# Patient Record
Sex: Female | Born: 1945 | Race: Black or African American | Hispanic: No | Marital: Married | Smoking: Never smoker
Health system: Southern US, Community
[De-identification: ages and names within clinical notes are randomized; demographics above are authoritative.]

## PROBLEM LIST (undated history)

## (undated) DIAGNOSIS — M1990 Unspecified osteoarthritis, unspecified site: Secondary | ICD-10-CM

## (undated) DIAGNOSIS — K219 Gastro-esophageal reflux disease without esophagitis: Secondary | ICD-10-CM

## (undated) DIAGNOSIS — I10 Essential (primary) hypertension: Secondary | ICD-10-CM

## (undated) DIAGNOSIS — E162 Hypoglycemia, unspecified: Secondary | ICD-10-CM

## (undated) HISTORY — PX: NECK SURGERY: SHX720

## (undated) HISTORY — PX: ABDOMINAL HYSTERECTOMY: SHX81

## (undated) HISTORY — PX: TONSILLECTOMY: SUR1361

## (undated) HISTORY — PX: CHOLECYSTECTOMY: SHX55

## (undated) HISTORY — PX: BACK SURGERY: SHX140

## (undated) HISTORY — PX: OTHER SURGICAL HISTORY: SHX169

## (undated) HISTORY — PX: GASTRIC BYPASS: SHX52

---

## 2010-02-25 ENCOUNTER — Encounter
Admission: RE | Admit: 2010-02-25 | Discharge: 2010-02-25 | Payer: Self-pay | Source: Home / Self Care | Attending: General Surgery | Admitting: General Surgery

## 2011-05-20 ENCOUNTER — Other Ambulatory Visit: Payer: Self-pay | Admitting: Gynecology

## 2011-05-20 DIAGNOSIS — R928 Other abnormal and inconclusive findings on diagnostic imaging of breast: Secondary | ICD-10-CM

## 2011-05-25 ENCOUNTER — Other Ambulatory Visit: Payer: Self-pay | Admitting: Gynecology

## 2011-05-25 ENCOUNTER — Ambulatory Visit
Admission: RE | Admit: 2011-05-25 | Discharge: 2011-05-25 | Disposition: A | Discharge status: Home / Self Care | Payer: Medicare Other | Source: Ambulatory Visit | Attending: Gynecology | Admitting: Gynecology

## 2011-05-25 DIAGNOSIS — R928 Other abnormal and inconclusive findings on diagnostic imaging of breast: Secondary | ICD-10-CM

## 2011-05-25 DIAGNOSIS — R921 Mammographic calcification found on diagnostic imaging of breast: Secondary | ICD-10-CM

## 2011-06-03 ENCOUNTER — Other Ambulatory Visit: Payer: Self-pay | Admitting: Gynecology

## 2011-06-03 ENCOUNTER — Ambulatory Visit
Admission: RE | Admit: 2011-06-03 | Discharge: 2011-06-03 | Disposition: A | Discharge status: Home / Self Care | Payer: Medicare Other | Source: Ambulatory Visit | Attending: Gynecology | Admitting: Gynecology

## 2011-06-03 DIAGNOSIS — R921 Mammographic calcification found on diagnostic imaging of breast: Secondary | ICD-10-CM

## 2013-04-14 ENCOUNTER — Other Ambulatory Visit: Payer: Self-pay

## 2013-04-14 ENCOUNTER — Ambulatory Visit (INDEPENDENT_AMBULATORY_CARE_PROVIDER_SITE_OTHER): Payer: Medicare Other

## 2013-04-14 VITALS — BP 168/91 | HR 72 | Resp 16 | Ht 62.0 in | Wt 220.0 lb

## 2013-04-14 DIAGNOSIS — M79609 Pain in unspecified limb: Secondary | ICD-10-CM

## 2013-04-14 DIAGNOSIS — M7750 Other enthesopathy of unspecified foot: Secondary | ICD-10-CM

## 2013-04-14 DIAGNOSIS — M1990 Unspecified osteoarthritis, unspecified site: Secondary | ICD-10-CM

## 2013-04-14 DIAGNOSIS — M79673 Pain in unspecified foot: Secondary | ICD-10-CM

## 2013-04-14 DIAGNOSIS — M25579 Pain in unspecified ankle and joints of unspecified foot: Secondary | ICD-10-CM

## 2013-04-14 DIAGNOSIS — S8252XP Displaced fracture of medial malleolus of left tibia, subsequent encounter for closed fracture with malunion: Secondary | ICD-10-CM

## 2013-04-14 DIAGNOSIS — IMO0002 Reserved for concepts with insufficient information to code with codable children: Secondary | ICD-10-CM

## 2013-04-14 MED ORDER — TRIAMCINOLONE ACETONIDE 10 MG/ML IJ SUSP: 10.0000 mg | Freq: Once | INTRAMUSCULAR | Status: AC

## 2013-04-14 NOTE — Progress Notes (Signed)
Subjective:    Patient ID: Marilyn Estrada, female    DOB: 01/23/1946, 68 y.o.   MRN: [id number]  HPI Comments: "My right foot hurts"  Patient c/o aching pain dorsal and sharp pain in arch right foot for about 3-4 months. She has seen Dr. Aida Puffer and he has given 2 cortisone injections, last one about 1 month ago, made custom arch supports, she has tried new shoes, was Rx'd meloxicam 7.5 QD-nothing has helped. Getting worse.  Foot Pain Associated symptoms include chills, diaphoresis, myalgias and numbness.      Review of Systems  Constitutional: Positive for chills and diaphoresis.  HENT: Positive for sinus pressure and tinnitus.   Eyes: Positive for visual disturbance.  Musculoskeletal: Positive for myalgias.  Skin: Positive for color change.       Change in nails   Allergic/Immunologic: Positive for food allergies.  Neurological: Positive for numbness.  Hematological: Bruises/bleeds easily.  All other systems reviewed and are negative.       Objective:   Physical Exam Okay objective findings as follows vascular status is intact with pedal pulses palpable DP and PT +2/4 bilateral capillary refill time 3 seconds all digits skin temperature warm turgor normal no edema rubor pallor or varicosities noted slight edema the forefoot but not significant neurologically epicritic and proprioceptive sensations intact and symmetric there is normal plantar response DTRs not listed dermatologically skin color pigment normal hair growth absent. Nails unremarkable. Orthopedic biomechanical exam there is pain on palpation of the ankle anterior medial malleoli are area to the toes painful and symptomatic on the right ankle no pain in the lateral malleoli are area no pain in the sinus tarsi patient has had injection the sinus tarsi area is where she's 22 however the case the pain is more anterior and medial and at this time along the medial ankle gutter and posterior medial ankle painful tender both  on palpation range of motion. X-rays reveal slightly long first metatarsal with digital contractures 2 through 5 sesamoid position to there's both inferior retrocalcaneal spurring and slight met adductus is noted in mild osteopenia is a slight excess radicular however that is asymptomatic on palpation. At this time after exam a second set of x-rays for ankle views were added and the AP view demonstrates a significant displacement of the medial malleoli are 10 looks like is evidence of old fracture slight asymmetric joint space narrowing of the medial ankle gutter is noted. Patient is in question and in fact indicates that she did have an old ankle fracture on her right ankle many years ago and she reinjured last in 2009 when she stepped in a hole and since that time she's had more of this pain in the medial and anterior ankle.       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 At this time cannot rule out arthropathy posse some osteophyte within the joint cannot be ruled out her osteocartilaginous injury as well the ankle. At this time suggested an additional trial treatment both therapeutic and diagnostic injection at this time of tender with Kenalog in 20 Xylocaine plain infiltrated to the medial ankle gutter. This provided some relief patient was also placed in ankle stabilizer at this time for the next 3-4 weeks plantar reappointed 3-4 weeks for reevaluation she continues to have pain in symptomology other noninvasive study either MRI or CT  scan would be beneficial. If in fact she continues to arthropathy difficulties in the ankle my recommendation would be referral for ankle arthroscopy possibly Dr. Clifton James dial or Dr. Doran Durand. Will reassess that need at next visit 4 weeks from now. Maintain ankle stabilizer also suggested ice to the ankle if  needed.  Harriet Masson DPM

## 2013-04-14 NOTE — Patient Instructions (Signed)
ICE INSTRUCTIONS  Apply ice or cold pack to the affected area at least 3 times a day for 10-15 minutes each time.  You should also use ice after prolonged activity or vigorous exercise.  Do not apply ice longer than 20 minutes at one time.  Always keep a cloth between your skin and the ice pack to prevent burns.  Being consistent and following these instructions will help control your symptoms.  We suggest you purchase a gel ice pack because they are reusable and do bit leak.  Some of them are designed to wrap around the area.  Use the method that works best for you.  Here are some other suggestions for icing.   Use a frozen bag of peas or corn-inexpensive and molds well to your body, usually stays frozen for 10 to 20 minutes.  Wet a towel with cold water and squeeze out the excess until it's damp.  Place in a bag in the freezer for 20 minutes. Then remove and use.  Maintain ankle stabilizer daily for the next 3 or 4 weeks.

## 2013-05-06 ENCOUNTER — Emergency Department (HOSPITAL_BASED_OUTPATIENT_CLINIC_OR_DEPARTMENT_OTHER)
Admission: EM | Admit: 2013-05-06 | Discharge: 2013-05-06 | Disposition: A | Discharge status: Home / Self Care | Payer: Medicare Other | Attending: Emergency Medicine | Admitting: Emergency Medicine

## 2013-05-06 ENCOUNTER — Emergency Department (HOSPITAL_BASED_OUTPATIENT_CLINIC_OR_DEPARTMENT_OTHER): Payer: Medicare Other

## 2013-05-06 ENCOUNTER — Encounter (HOSPITAL_BASED_OUTPATIENT_CLINIC_OR_DEPARTMENT_OTHER): Payer: Self-pay | Admitting: Emergency Medicine

## 2013-05-06 DIAGNOSIS — R5381 Other malaise: Secondary | ICD-10-CM | POA: Insufficient documentation

## 2013-05-06 DIAGNOSIS — R5383 Other fatigue: Secondary | ICD-10-CM

## 2013-05-06 DIAGNOSIS — R42 Dizziness and giddiness: Secondary | ICD-10-CM | POA: Insufficient documentation

## 2013-05-06 DIAGNOSIS — Z8739 Personal history of other diseases of the musculoskeletal system and connective tissue: Secondary | ICD-10-CM | POA: Insufficient documentation

## 2013-05-06 DIAGNOSIS — Z8669 Personal history of other diseases of the nervous system and sense organs: Secondary | ICD-10-CM | POA: Insufficient documentation

## 2013-05-06 DIAGNOSIS — Z79899 Other long term (current) drug therapy: Secondary | ICD-10-CM | POA: Insufficient documentation

## 2013-05-06 DIAGNOSIS — M79609 Pain in unspecified limb: Secondary | ICD-10-CM | POA: Insufficient documentation

## 2013-05-06 DIAGNOSIS — R61 Generalized hyperhidrosis: Secondary | ICD-10-CM | POA: Insufficient documentation

## 2013-05-06 DIAGNOSIS — I10 Essential (primary) hypertension: Secondary | ICD-10-CM | POA: Insufficient documentation

## 2013-05-06 HISTORY — DX: Essential (primary) hypertension: I10

## 2013-05-06 HISTORY — DX: Unspecified osteoarthritis, unspecified site: M19.90

## 2013-05-06 LAB — COMPREHENSIVE METABOLIC PANEL
ALBUMIN: 3.6 g/dL (ref 3.5–5.2)
ALK PHOS: 162 U/L — AB (ref 39–117)
ALT: 16 U/L (ref 0–35)
AST: 19 U/L (ref 0–37)
BILIRUBIN TOTAL: 0.2 mg/dL — AB (ref 0.3–1.2)
BUN: 18 mg/dL (ref 6–23)
CHLORIDE: 104 meq/L (ref 96–112)
CO2: 28 meq/L (ref 19–32)
Calcium: 9 mg/dL (ref 8.4–10.5)
Creatinine, Ser: 0.6 mg/dL (ref 0.50–1.10)
GFR calc Af Amer: >90 mL/min (ref >90)
Glucose, Bld: 93 mg/dL (ref 70–99)
POTASSIUM: 4.4 meq/L (ref 3.7–5.3)
Sodium: 142 mEq/L (ref 137–147)
Total Protein: 7 g/dL (ref 6.0–8.3)

## 2013-05-06 LAB — CBC WITH DIFFERENTIAL/PLATELET
BASOS PCT: 0 % (ref 0–1)
Basophils Absolute: 0 10*3/uL (ref 0.0–0.1)
Eosinophils Absolute: 0.2 10*3/uL (ref 0.0–0.7)
Eosinophils Relative: 4 % (ref 0–5)
HEMATOCRIT: 36.2 % (ref 36.0–46.0)
HEMOGLOBIN: 11.4 g/dL — AB (ref 12.0–15.0)
LYMPHS PCT: 40 % (ref 12–46)
Lymphs Abs: 2.4 10*3/uL (ref 0.7–4.0)
MCH: 25.7 pg — ABNORMAL LOW (ref 26.0–34.0)
MCHC: 31.5 g/dL (ref 30.0–36.0)
MCV: 81.7 fL (ref 78.0–100.0)
MONOS PCT: 7 % (ref 3–12)
Monocytes Absolute: 0.4 10*3/uL (ref 0.1–1.0)
NEUTROS ABS: 3.1 10*3/uL (ref 1.7–7.7)
NEUTROS PCT: 50 % (ref 43–77)
Platelets: 227 10*3/uL (ref 150–400)
RBC: 4.43 MIL/uL (ref 3.87–5.11)
RDW: 14.8 % (ref 11.5–15.5)
WBC: 6.1 10*3/uL (ref 4.0–10.5)

## 2013-05-06 LAB — TROPONIN I

## 2013-05-06 MED ORDER — MECLIZINE HCL 25 MG PO TABS: 25.0000 mg | ORAL_TABLET | Freq: Three times a day (TID) | ORAL | Status: AC | PRN

## 2013-05-06 MED ORDER — MECLIZINE HCL 25 MG PO TABS
25.0000 mg | ORAL_TABLET | Freq: Once | ORAL | Status: AC
  Administered 2013-05-06: 25 mg via ORAL
  Filled 2013-05-06: qty 1

## 2013-05-06 NOTE — Discharge Instructions (Signed)
Meclizine as prescribed as needed for dizziness.  Plenty of fluids and get plenty of rest for the next several days.  Return to the emergency department if your symptoms substantially worsen or change.   Benign Positional Vertigo Vertigo means you feel like you or your surroundings are moving when they are not. Benign positional vertigo is the most common form of vertigo. Benign means that the cause of your condition is not serious. Benign positional vertigo is more common in older adults. CAUSES  Benign positional vertigo is the result of an upset in the labyrinth system. This is an area in the middle ear that helps control your balance. This may be caused by a viral infection, head injury, or repetitive motion. However, often no specific cause is found. SYMPTOMS  Symptoms of benign positional vertigo occur when you move your head or eyes in different directions. Some of the symptoms may include:  Loss of balance and falls.  Vomiting.  Blurred vision.  Dizziness.  Nausea.  Involuntary eye movements (nystagmus). DIAGNOSIS  Benign positional vertigo is usually diagnosed by physical exam. If the specific cause of your benign positional vertigo is unknown, your caregiver may perform imaging tests, such as magnetic resonance imaging (MRI) or computed tomography (CT). TREATMENT  Your caregiver may recommend movements or procedures to correct the benign positional vertigo. Medicines such as meclizine, benzodiazepines, and medicines for nausea may be used to treat your symptoms. In rare cases, if your symptoms are caused by certain conditions that affect the inner ear, you may need surgery. HOME CARE INSTRUCTIONS   Follow your caregiver's instructions.  Move slowly. Do not make sudden body or head movements.  Avoid driving.  Avoid operating heavy machinery.  Avoid performing any tasks that would be dangerous to you or others during a vertigo episode.  Drink enough fluids to keep  your urine clear or pale yellow. SEEK IMMEDIATE MEDICAL CARE IF:   You develop problems with walking, weakness, numbness, or using your arms, hands, or legs.  You have difficulty speaking.  You develop severe headaches.  Your nausea or vomiting continues or gets worse.  You develop visual changes.  Your family or friends notice any behavioral changes.  Your condition gets worse.  You have a fever.  You develop a stiff neck or sensitivity to light. MAKE SURE YOU:   Understand these instructions.  Will watch your condition.  Will get help right away if you are not doing well or get worse. Document Released: 11/03/2005 Document Revised: 04/20/2011 Document Reviewed: 10/16/2010 [facility] Patient Information 2014 [place].

## 2013-05-06 NOTE — ED Provider Notes (Signed)
CSN: [id number]     Arrival date & time 05/06/13  1731 History   None   This chart was scribed for Veryl Speak, MD by Terressa Koyanagi, ED Scribe. This patient was seen in room MH07/MH07 and the patient's care was started at 6:07 PM.  PCP: Omer Jack, MD  Chief Complaint  Patient presents with  . Dizziness   The history is provided by the patient. No language interpreter was used.   HPI Comments: Marilyn Estrada is a 68 y.o. female, with a history of HTN, chronic tinnitus and arthritis, who presents to the Emergency Department for two separate complaints. The pt first complains of intermittent lower, bilateral pain ("cramps") onset approximately a month ago, worsening in the last couple of weeks. Pt reports that the cramps are worse in the morning than at night; further, pt states that her legs cramp up when she tries to walk or stretches out. She reports that she has difficulty getting out of the bed in the morning. Pt describes the pain as severe. Pt reports she has been to multiple different providers of different specialties regarding the same, however, has not gotten a diagnosis.   In her secondary complaint, pt complains of worsening, intermittent dizziness onset one month ago worsening yesterday.Pt reports last night her symptoms worsened and she began to experience (1) sensations of unsteadiness and lightheadedness; (2) diaphoresis; and (3) fatigue. Pt reports that she feels uncomfortable driving due to the episodes of dizziness. Pt reports that when she turns her head quickly she gets dizzy. Pt denies any connection between her first complaint and her second complaint. Pt reports presently she feels light headed.   Past Medical History  Diagnosis Date  . Hypertension   . Arthritis    Past Surgical History  Procedure Laterality Date  . Back surgery    . Neck surgery    . Bladder tack    . Cholecystectomy    . Gastric bypass    . Tonsillectomy    . Abdominal hysterectomy      No family history on file. History  Substance Use Topics  . Smoking status: Never Smoker   . Smokeless tobacco: Never Used  . Alcohol Use: No   OB History   Grav Para Term Preterm Abortions TAB SAB Ect Mult Living                 Review of Systems  All other systems reviewed and are negative.   A complete 10 system review of systems was obtained and all systems are negative except as noted in the HPI and PMH.     Allergies  Sulfa antibiotics  Home Medications   Current Outpatient Rx  Name  Route  Sig  Dispense  Refill  . Cholecalciferol (VITAMIN D PO)   Oral   Take by mouth.         . Esomeprazole Magnesium (NEXIUM PO)   Oral   Take by mouth.         . Fe Fum-FePoly-Vit C-Vit B3 (INTEGRA PO)   Oral   Take by mouth.          Triage Vitals: BP 181/85  Pulse 84  Temp(Src) 98.8 F (37.1 C) (Oral)  Resp 20  SpO2 99% Physical Exam  Nursing note and vitals reviewed. Constitutional: She is oriented to person, place, and time. She appears well-developed and well-nourished. No distress.  HENT:  Head: Normocephalic and atraumatic.  Mouth/Throat: Oropharynx is clear and moist.  TMs  clear.   Eyes: EOM are normal.  Neck: Neck supple. No tracheal deviation present.  Cardiovascular: Normal rate.   Pulmonary/Chest: Effort normal. No respiratory distress.  Abdominal: She exhibits no distension. There is no tenderness.  Musculoskeletal: Normal range of motion. She exhibits no edema and no tenderness.  Neurological: She is alert and oriented to person, place, and time. No cranial nerve deficit. Coordination normal.  Skin: Skin is warm and dry.  Psychiatric: She has a normal mood and affect. Her behavior is normal.    ED Course  Procedures (including critical care time) DIAGNOSTIC STUDIES: Oxygen Saturation is 99% on RA, normal by my interpretation.    COORDINATION OF CARE: 6:18 PM-Discussed treatment plan which includes labs, EKG, imaging and meds with pt  at bedside and pt agreed to plan.   Labs Review Labs Reviewed  CBC WITH DIFFERENTIAL  COMPREHENSIVE METABOLIC PANEL  TROPONIN I   Imaging Review No results found.   EKG Interpretation   Date/Time:  Saturday May 06 2013 18:26:49 EDT Ventricular Rate:  70 PR Interval:  202 QRS Duration: 78 QT Interval:  398 QTC Calculation: 429 R Axis:   14 Text Interpretation:  Normal sinus rhythm Normal ECG Confirmed by DELOS   MD, Kamorah Nevils ([id number]) on 05/06/2013 8:07:25 PM      MDM   Final diagnoses:  None    Patient is a 68 year old female who presents with complaints of dizziness. Her symptoms appear vertiginous in nature. She is feeling better with meclizine. Workup reveals no acute process. Her laboratory studies, CT of the head, and EKG are all unremarkable. She will be discharged to home with by mouth meclizine and when necessary followup.   I personally performed the services described in this documentation, which was scribed in my presence. The recorded information has been reviewed and is accurate.      Veryl Speak, MD 05/06/13 [phone]

## 2013-05-06 NOTE — ED Notes (Addendum)
States dizzy x 1 week- got "really bad" last night- states feels like the room is spinning and she feels like she is going to fall. Reports she is also having "cramps"

## 2013-05-10 ENCOUNTER — Ambulatory Visit (INDEPENDENT_AMBULATORY_CARE_PROVIDER_SITE_OTHER): Payer: Medicare Other

## 2013-05-10 VITALS — BP 164/87 | HR 62 | Resp 12

## 2013-05-10 DIAGNOSIS — M79673 Pain in unspecified foot: Secondary | ICD-10-CM

## 2013-05-10 DIAGNOSIS — M1990 Unspecified osteoarthritis, unspecified site: Secondary | ICD-10-CM

## 2013-05-10 DIAGNOSIS — M79609 Pain in unspecified limb: Secondary | ICD-10-CM

## 2013-05-10 DIAGNOSIS — M7750 Other enthesopathy of unspecified foot: Secondary | ICD-10-CM

## 2013-05-10 NOTE — Patient Instructions (Signed)
ICE INSTRUCTIONS  Apply ice or cold pack to the affected area at least 3 times a day for 10-15 minutes each time.  You should also use ice after prolonged activity or vigorous exercise.  Do not apply ice longer than 20 minutes at one time.  Always keep a cloth between your skin and the ice pack to prevent burns.  Being consistent and following these instructions will help control your symptoms.  We suggest you purchase a gel ice pack because they are reusable and do bit leak.  Some of them are designed to wrap around the area.  Use the method that works best for you.  Here are some other suggestions for icing.   Use a frozen bag of peas or corn-inexpensive and molds well to your body, usually stays frozen for 10 to 20 minutes.  Wet a towel with cold water and squeeze out the excess until it's damp.  Place in a bag in the freezer for 20 minutes. Then remove and use.  Continued use the ankle stabilizer brace for another month 2 months on an intermittent basis as needed especially if walking uneven grass gravel or uneven ground or doing yard work or other strenuous activities.  Obtaining maintain a good stable thick soled shoe preferably a lace up oxford type shoe no flimsy shoes no flip-flops no barefoot walking. Maintain the use of your orthotics or arch supports with your shoes at all times followup in the next month or 2 if it fails to improve or you feel adjustments are needed to the orthotic.

## 2013-05-10 NOTE — Progress Notes (Signed)
   Subjective:    Patient ID: Marilyn Estrada, female    DOB: 1945-03-17, 68 y.o.   MRN: [id number]  HPI '' RT FOOT IS DOING SOME BETTER.''    Review of Systems no new changes or finding     Objective:   Physical Exam Neurovascular status is intact pedal pulses palpable patient wearing the ankle stabilizer and is definitely improved with use a stabilizer an NSAID therapy. Still some slight tenderness the medial ankle area medial ankle gutter although not as severe as haven't forced slight tenderness over the dorsal foot anterior ankle patient does have some spurring at the Lisfranc joint dorsally which may be addressed at a future date if needed patient is advised she is arthrosis and arthropathy of the ankle and mid tarsus of the foot and Lisfranc joint of the foot she is to maintain a stable shoe currently wearing a shifts somewhat to soft suggested the L3 as [place] and to maintain orthotics which she does not have with her at today's visit. Patient does have custom orthoses advised to maintain using is a regular basis and a good stable shoe for solid foundation also suggested maintaining the ankle stabilizer for a month or 2 and then anytime she is doing strenuous activities walking on uneven surfaces grass gravel etc.       Assessment & Plan:  Assessment this time is improving capsulitis and ankle arthrosis and mid tarsus or arthropathy of the foot utilizing and ankle stabilizer in a more stable shoe in as well as NSAIDs. If she continues any problems or recurrence followup in the future and as-needed basis if her orthotic adjustments or for possible further noninvasive studies would consider an MRI if she continues to have soft tissue problems or difficulties may be candidate for MRI and possible ligament or tendon repairs if needed or cartilage surgeries it was noted that cartilage is the issue. Otherwise maintain ice followup as needed  Harriet Masson DPM

## 2013-05-12 ENCOUNTER — Ambulatory Visit: Payer: Medicare Other

## 2013-12-29 ENCOUNTER — Other Ambulatory Visit: Payer: Self-pay | Admitting: Internal Medicine

## 2013-12-29 DIAGNOSIS — Z1231 Encounter for screening mammogram for malignant neoplasm of breast: Secondary | ICD-10-CM

## 2014-01-16 ENCOUNTER — Ambulatory Visit: Payer: Medicare Other

## 2014-01-22 ENCOUNTER — Ambulatory Visit: Payer: Medicare Other

## 2015-05-22 ENCOUNTER — Encounter (HOSPITAL_BASED_OUTPATIENT_CLINIC_OR_DEPARTMENT_OTHER): Payer: Self-pay | Admitting: Emergency Medicine

## 2015-05-22 ENCOUNTER — Emergency Department (HOSPITAL_BASED_OUTPATIENT_CLINIC_OR_DEPARTMENT_OTHER)
Admission: EM | Admit: 2015-05-22 | Discharge: 2015-05-22 | Disposition: A | Discharge status: Home / Self Care | Payer: Medicare Other | Attending: Emergency Medicine | Admitting: Emergency Medicine

## 2015-05-22 DIAGNOSIS — N764 Abscess of vulva: Secondary | ICD-10-CM | POA: Insufficient documentation

## 2015-05-22 DIAGNOSIS — I10 Essential (primary) hypertension: Secondary | ICD-10-CM | POA: Diagnosis not present

## 2015-05-22 MED ORDER — HYDROCODONE-ACETAMINOPHEN 5-325 MG PO TABS: 1.0000 | ORAL_TABLET | ORAL | Status: AC | PRN

## 2015-05-22 MED ORDER — LIDOCAINE-EPINEPHRINE 2 %-1:100000 IJ SOLN
INTRAMUSCULAR | Status: AC
  Administered 2015-05-22: 1 mL
  Filled 2015-05-22: qty 1

## 2015-05-22 NOTE — ED Provider Notes (Signed)
CSN: [id number]     Arrival date & time 05/22/15  0222 History   First MD Initiated Contact with Patient 05/22/15 0242     Chief Complaint  Patient presents with  . Abscess     (Consider location/radiation/quality/duration/timing/severity/associated sxs/prior Treatment) HPI  This is a 70 year old female with a one-week history of a tender swollen area of the left labium majus. It began as a small bump in his gradually increased in size and tenderness. The lesion has been oozing purulent material but she has been unable to bring it to a head with a piece of fat back. There is moderate to severe pain, worse with movement or palpation. She has not had a fever.  Past Medical History  Diagnosis Date  . Hypertension   . Arthritis    Past Surgical History  Procedure Laterality Date  . Back surgery    . Neck surgery    . Bladder tack    . Cholecystectomy    . Gastric bypass    . Tonsillectomy    . Abdominal hysterectomy     History reviewed. No pertinent family history. Social History  Substance Use Topics  . Smoking status: Never Smoker   . Smokeless tobacco: Never Used  . Alcohol Use: No   OB History    No data available     Review of Systems  All other systems reviewed and are negative.   Allergies  Sulfa antibiotics  Home Medications   Prior to Admission medications   Medication Sig Start Date End Date Taking? Authorizing Provider  Cholecalciferol (VITAMIN D PO) Take by mouth.    Historical Provider, MD  Esomeprazole Magnesium (NEXIUM PO) Take by mouth.    Historical Provider, MD  Fe Fum-FePoly-Vit C-Vit B3 (INTEGRA PO) Take by mouth.    Historical Provider, MD  meclizine (ANTIVERT) 25 MG tablet Take 1 tablet (25 mg total) by mouth 3 (three) times daily as needed for dizziness. 05/06/13   Veryl Speak, MD   BP 196/106 mmHg  Pulse 88  Temp(Src) 98.1 F (36.7 C) (Oral)  Resp 18  SpO2 97%   Physical Exam  General: Well-developed, well-nourished female in no  acute distress; appearance consistent with age of record HENT: normocephalic; atraumatic Eyes: pupils equal, round and reactive to light; extraocular muscles intact Neck: supple Heart: regular rate and rhythm Lungs: Normal respiratory effort and excursion Abdomen: soft; nondistended GU: Pointing abscess of left labium majus with surrounding edema and tenderness Extremities: No deformity; full range of motion Neurologic: Awake, alert and oriented; motor function intact in all extremities and symmetric; no facial droop Skin: Warm and dry Psychiatric: Normal mood and affect    ED Course  Procedures (including critical care time)  INCISION AND DRAINAGE Performed by: Wynetta Fines Consent: Verbal consent obtained. Risks and benefits: risks, benefits and alternatives were discussed Type: abscess  Body area: Left labium majus  Anesthesia: local infiltration  Incision was made with a scalpel.  Local anesthetic: lidocaine 2 % with epinephrine  Anesthetic total: 1.5 ml  Complexity: complex Blunt dissection to break up loculations  Drainage: purulent  Drainage amount: Moderate   Packing material: None   Patient tolerance: Patient tolerated the procedure well with no immediate complications.     MDM    Shanon Rosser, MD 05/22/15 [phone]

## 2015-05-22 NOTE — ED Notes (Signed)
Pt given d/c instructions. Rx x 1 Verbalizes understanding. No questions. 

## 2015-05-22 NOTE — ED Notes (Signed)
"  Started as a little bump that kept growing" Pt reports painful bump on vaginal area

## 2015-05-22 NOTE — ED Notes (Signed)
Pt reports abscess on vaginal area onset since Monday past that has increased in size since then and has become more painful.

## 2016-05-12 ENCOUNTER — Ambulatory Visit (INDEPENDENT_AMBULATORY_CARE_PROVIDER_SITE_OTHER): Payer: Medicare Other

## 2016-05-12 ENCOUNTER — Ambulatory Visit (INDEPENDENT_AMBULATORY_CARE_PROVIDER_SITE_OTHER): Payer: Medicare Other | Admitting: Podiatry

## 2016-05-12 DIAGNOSIS — M2042 Other hammer toe(s) (acquired), left foot: Secondary | ICD-10-CM | POA: Diagnosis not present

## 2016-05-12 DIAGNOSIS — L84 Corns and callosities: Secondary | ICD-10-CM

## 2016-05-12 DIAGNOSIS — M779 Enthesopathy, unspecified: Secondary | ICD-10-CM | POA: Diagnosis not present

## 2016-05-12 DIAGNOSIS — M79672 Pain in left foot: Secondary | ICD-10-CM

## 2016-05-12 MED ORDER — TRIAMCINOLONE ACETONIDE 10 MG/ML IJ SUSP
10.0000 mg | Freq: Once | INTRAMUSCULAR | Status: AC
  Administered 2016-05-12: 10 mg

## 2016-05-25 NOTE — Progress Notes (Signed)
Subjective:     Patient ID: Marilyn Estrada, female   DOB: 1945-08-06, 71 y.o.   MRN: [id number]  HPI patient states that she has had a lot of problems with the fifth digit left with rotation of the toe lesion formation and pain when palpated. Patient is tried to wear wider shoes and try to wear padding without relief   Review of Systems  All other systems reviewed and are negative.      Objective:   Physical Exam  Constitutional: She is oriented to person, place, and time.  Cardiovascular: Intact distal pulses.   Musculoskeletal: Normal range of motion.  Neurological: She is oriented to person, place, and time.  Skin: Skin is warm and dry.  Nursing note and vitals reviewed.  neurovascular status found to be intact with muscle strength adequate range of motion within normal limits. Patient is noted to have a rotated fifth digit left with fluid buildup at the interphalangeal joint keratotic tissue formation and very painful when pressed. Patient is tried trimming and wider shoes without relief     Assessment:     Inflammatory interphalangeal joint capsulitis digit 5 left with keratotic lesion formation that's painful when pressed    Plan:     H&P and condition reviewed and at this time I explained surgery versus trying to solve it without cutting. She wants to try this approach and today I went ahead and did a proximal nerve block left fifth digit did a careful interphalangeal joint to milligrams Dexon some Kenalog and debrided the lesion fully with no iatrogenic bleeding noted  X-ray indicates mild rotation of the toe with lesion formation

## 2016-08-27 ENCOUNTER — Other Ambulatory Visit: Payer: Self-pay | Admitting: Otolaryngology

## 2016-08-27 DIAGNOSIS — H9311 Tinnitus, right ear: Secondary | ICD-10-CM

## 2016-08-27 DIAGNOSIS — H903 Sensorineural hearing loss, bilateral: Secondary | ICD-10-CM

## 2016-09-06 ENCOUNTER — Ambulatory Visit
Admission: RE | Admit: 2016-09-06 | Discharge: 2016-09-06 | Disposition: A | Discharge status: Home / Self Care | Payer: Medicare Other | Source: Ambulatory Visit | Attending: Otolaryngology | Admitting: Otolaryngology

## 2016-09-06 DIAGNOSIS — H903 Sensorineural hearing loss, bilateral: Secondary | ICD-10-CM

## 2016-09-06 DIAGNOSIS — H9311 Tinnitus, right ear: Secondary | ICD-10-CM

## 2016-09-06 MED ORDER — GADOBENATE DIMEGLUMINE 529 MG/ML IV SOLN
20.0000 mL | Freq: Once | INTRAVENOUS | Status: AC | PRN
  Administered 2016-09-06: 20 mL via INTRAVENOUS

## 2017-07-24 ENCOUNTER — Encounter (HOSPITAL_BASED_OUTPATIENT_CLINIC_OR_DEPARTMENT_OTHER): Payer: Self-pay | Admitting: Emergency Medicine

## 2017-07-24 ENCOUNTER — Other Ambulatory Visit: Payer: Self-pay

## 2017-07-24 ENCOUNTER — Emergency Department (HOSPITAL_BASED_OUTPATIENT_CLINIC_OR_DEPARTMENT_OTHER)
Admission: EM | Admit: 2017-07-24 | Discharge: 2017-07-24 | Disposition: A | Discharge status: Home / Self Care | Payer: Medicare Other | Attending: Emergency Medicine | Admitting: Emergency Medicine

## 2017-07-24 DIAGNOSIS — K2970 Gastritis, unspecified, without bleeding: Secondary | ICD-10-CM | POA: Diagnosis not present

## 2017-07-24 DIAGNOSIS — I10 Essential (primary) hypertension: Secondary | ICD-10-CM | POA: Diagnosis not present

## 2017-07-24 DIAGNOSIS — Z79899 Other long term (current) drug therapy: Secondary | ICD-10-CM | POA: Insufficient documentation

## 2017-07-24 DIAGNOSIS — R1013 Epigastric pain: Secondary | ICD-10-CM | POA: Diagnosis present

## 2017-07-24 HISTORY — DX: Hypoglycemia, unspecified: E16.2

## 2017-07-24 LAB — CBC
HEMATOCRIT: 38.8 % (ref 36.0–46.0)
HEMOGLOBIN: 12.4 g/dL (ref 12.0–15.0)
MCH: 25.7 pg — ABNORMAL LOW (ref 26.0–34.0)
MCHC: 32 g/dL (ref 30.0–36.0)
MCV: 80.5 fL (ref 78.0–100.0)
Platelets: 292 10*3/uL (ref 150–400)
RBC: 4.82 MIL/uL (ref 3.87–5.11)
RDW: 15.6 % — ABNORMAL HIGH (ref 11.5–15.5)
WBC: 4.8 10*3/uL (ref 4.0–10.5)

## 2017-07-24 LAB — COMPREHENSIVE METABOLIC PANEL
ALK PHOS: 141 U/L — AB (ref 38–126)
ALT: 15 U/L (ref 14–54)
ANION GAP: 10 (ref 5–15)
AST: 22 U/L (ref 15–41)
Albumin: 3.9 g/dL (ref 3.5–5.0)
BILIRUBIN TOTAL: 0.3 mg/dL (ref 0.3–1.2)
BUN: 13 mg/dL (ref 6–20)
CALCIUM: 7.7 mg/dL — AB (ref 8.9–10.3)
CO2: 26 mmol/L (ref 22–32)
Chloride: 107 mmol/L (ref 101–111)
Creatinine, Ser: 0.65 mg/dL (ref 0.44–1.00)
Glucose, Bld: 103 mg/dL — ABNORMAL HIGH (ref 65–99)
POTASSIUM: 3.5 mmol/L (ref 3.5–5.1)
Sodium: 143 mmol/L (ref 135–145)
TOTAL PROTEIN: 7.6 g/dL (ref 6.5–8.1)

## 2017-07-24 LAB — URINALYSIS, ROUTINE W REFLEX MICROSCOPIC
Glucose, UA: NEGATIVE mg/dL
Ketones, ur: 15 mg/dL — AB
LEUKOCYTES UA: NEGATIVE
NITRITE: NEGATIVE
PH: 6 (ref 5.0–8.0)
Protein, ur: 30 mg/dL — AB

## 2017-07-24 LAB — LIPASE, BLOOD: Lipase: 27 U/L (ref 11–51)

## 2017-07-24 LAB — URINALYSIS, MICROSCOPIC (REFLEX): WBC UA: NONE SEEN WBC/hpf (ref 0–5)

## 2017-07-24 LAB — OCCULT BLOOD X 1 CARD TO LAB, STOOL: FECAL OCCULT BLD: NEGATIVE

## 2017-07-24 MED ORDER — RANITIDINE HCL 75 MG PO TABS: 75.0000 mg | ORAL_TABLET | Freq: Two times a day (BID) | ORAL | 0 refills | Status: AC

## 2017-07-24 MED ORDER — GI COCKTAIL ~~LOC~~
30.0000 mL | Freq: Once | ORAL | Status: AC
  Administered 2017-07-24: 30 mL via ORAL
  Filled 2017-07-24: qty 30

## 2017-07-24 MED ORDER — SUCRALFATE 1 G PO TABS: 1.0000 g | ORAL_TABLET | Freq: Three times a day (TID) | ORAL | 0 refills | Status: AC

## 2017-07-24 MED ORDER — SODIUM CHLORIDE 0.9 % IV BOLUS
1000.0000 mL | Freq: Once | INTRAVENOUS | Status: AC
  Administered 2017-07-24: 1000 mL via INTRAVENOUS

## 2017-07-24 NOTE — ED Notes (Signed)
Pt unable to void at this time. 

## 2017-07-24 NOTE — ED Triage Notes (Signed)
Generalized abd pain since Thursday, described as sharp and stabbing. Denies N/V, endorses loose stools.

## 2017-07-24 NOTE — ED Provider Notes (Signed)
[facility] EMERGENCY DEPARTMENT Provider Note   CSN: [id number] Arrival date & time: 07/24/17  2993     History   Chief Complaint Chief Complaint  Patient presents with  . Abdominal Pain    HPI Marilyn Estrada is a 72 y.o. female.  HPI   Patient presents today with epigastric abdominal pain beginning Thursday.  No new medications, denies over-the-counter NSAIDs.  States this is never happened to her before, and also notes 2 loose stools per day.  States her stools were black, thinks this is from vitamin D use.  Abdominal pain is crampy and "coming and going," but present most of the day.  She has not tried anything for this.  No history of ulcers or GERD per her report.  States she recently had a normal colonoscopy.  Pain does not radiate, not improved by eating.  Has had little appetite over the past 2 days.  No nausea or vomiting.  Past Medical History:  Diagnosis Date  . Arthritis   . Hypertension   . Hypoglycemia     There are no active problems to display for this patient.   Past Surgical History:  Procedure Laterality Date  . ABDOMINAL HYSTERECTOMY    . BACK SURGERY    . bladder tack    . CHOLECYSTECTOMY    . GASTRIC BYPASS    . NECK SURGERY    . TONSILLECTOMY       OB History   None      Home Medications    Prior to Admission medications   Medication Sig Start Date End Date Taking? Authorizing Provider  UNKNOWN TO PATIENT    Yes [provider]  albuterol (PROVENTIL HFA;VENTOLIN HFA) 108 (90 Base) MCG/ACT inhaler Inhale into the lungs.    [provider]  Cholecalciferol (VITAMIN D PO) Take by mouth.    [provider]  EPINEPHrine 0.3 mg/0.3 mL IJ SOAJ injection Inject 0.3 mg into the muscle.    [provider]  Esomeprazole Magnesium (NEXIUM PO) Take by mouth.    [provider]  Fe Fum-FePoly-Vit C-Vit B3 (INTEGRA PO) Take by mouth.    [provider]  HYDROcodone-acetaminophen (NORCO)  5-325 MG tablet Take 1 tablet by mouth every 4 (four) hours as needed (for pain; may cause constipation). Patient not taking: Reported on 05/12/2016 05/22/15   Molpus, Jenny Reichmann, MD  meclizine (ANTIVERT) 25 MG tablet Take 1 tablet (25 mg total) by mouth 3 (three) times daily as needed for dizziness. Patient not taking: Reported on 05/12/2016 05/06/13   Veryl Speak, MD  ranitidine (ZANTAC) 75 MG tablet Take 1 tablet (75 mg total) by mouth 2 (two) times daily. 07/24/17   Sela Hilding, MD  sucralfate (CARAFATE) 1 g tablet Take 1 tablet (1 g total) by mouth 4 (four) times daily -  with meals and at bedtime. Mix with 1c water before using. 07/24/17   Sela Hilding, MD  triamterene-hydrochlorothiazide [facility]) 37.5-25 MG tablet  04/02/16   [provider]  Wheat Dextrin (BENEFIBER) POWD Take 3 g by mouth.    [provider]    Family History No family history on file.  Social History Social History   Tobacco Use  . Smoking status: Never Smoker  . Smokeless tobacco: Never Used  Substance Use Topics  . Alcohol use: No  . Drug use: No     Allergies   Bee venom; Sulfa antibiotics; Tramadol; and Shellfish-derived products   Review of Systems Review of  Systems  Constitutional: Positive for appetite change. Negative for activity change.  HENT: Negative for mouth sores and trouble swallowing.   Respiratory: Negative for chest tightness and shortness of breath.   Cardiovascular: Negative for chest pain.  Gastrointestinal: Positive for abdominal pain. Negative for abdominal distention, blood in stool, nausea and vomiting.  Neurological: Negative for dizziness.     Physical Exam Updated Vital Signs BP 133/68   Pulse 68   Temp 98.7 F (37.1 C) (Oral)   Resp 18   Ht '5\' 2"'$  (1.575 m)   Wt 102.5 kg (226 lb)   SpO2 99%   BMI 41.34 kg/m   Physical Exam  Constitutional: She appears well-developed and well-nourished. She does not appear ill. No distress.  HENT:    Head: Normocephalic.  Mouth/Throat: Oropharynx is clear and moist.  Cardiovascular: Normal rate, regular rhythm and normal heart sounds.  No murmur heard. Pulmonary/Chest: Effort normal and breath sounds normal.  Abdominal: Soft. Normal appearance and bowel sounds are normal. She exhibits no distension. There is tenderness in the epigastric area. There is no guarding.  Genitourinary: Rectum normal. Rectal exam shows no mass, no tenderness and guaiac negative stool.  Psychiatric: She has a normal mood and affect. Her behavior is normal.     ED Treatments / Results  Labs (all labs ordered are listed, but only abnormal results are displayed) Labs Reviewed  COMPREHENSIVE METABOLIC PANEL - Abnormal; Notable for the following components:      Result Value   Glucose, Bld 103 (*)    Calcium 7.7 (*)    Alkaline Phosphatase 141 (*)    All other components within normal limits  CBC - Abnormal; Notable for the following components:   MCH 25.7 (*)    RDW 15.6 (*)    All other components within normal limits  URINALYSIS, ROUTINE W REFLEX MICROSCOPIC - Abnormal; Notable for the following components:   APPearance CLOUDY (*)    Specific Gravity, Urine >1.030 (*)    Hgb urine dipstick TRACE (*)    Bilirubin Urine SMALL (*)    Ketones, ur 15 (*)    Protein, ur 30 (*)    All other components within normal limits  URINALYSIS, MICROSCOPIC (REFLEX) - Abnormal; Notable for the following components:   Bacteria, UA MANY (*)    All other components within normal limits  LIPASE, BLOOD  OCCULT BLOOD X 1 CARD TO LAB, STOOL    EKG None  Radiology No results found.  Procedures Procedures (including critical care time)  Medications Ordered in ED Medications  sodium chloride 0.9 % bolus 1,000 mL (1,000 mLs Intravenous New Bag/Given 07/24/17 1100)  gi cocktail (Maalox,Lidocaine,Donnatal) (30 mLs Oral Given 07/24/17 1018)     Initial Impression / Assessment and Plan / ED Course  I have reviewed  the triage vital signs and the nursing notes.  Pertinent labs & imaging results that were available during my care of the patient were reviewed by me and considered in my medical decision making (see chart for details).     Patient presents with epigastric abdominal pain, differential includes GERD, peptic ulcer disease, subacute GI bleed given black stools.  Will assess FOBT and CMP, lipase, CBC.  Will attempt GI cocktail to see whether this improves pain and narrows differential.  Pain significantly improved with GI cocktail, labs remarkable for what appears to be chronically elevated alk phos.  Stool card negative, urine without signs of overt infection and no dysuria.  Lipase negative.  Will discharge  with Zantac and Carafate to use as needed.  Follow-up with PCP if continued symptoms.  Patient denies cardiac history, reports that she feels dehydrated as she has not had much to eat or drink yesterday or today.  Will give 1 L normal saline bolus prior to discharge.  Final Clinical Impressions(s) / ED Diagnoses   Final diagnoses:  None    ED Discharge Orders        Ordered    ranitidine (ZANTAC) 75 MG tablet  2 times daily     07/24/17 1104    sucralfate (CARAFATE) 1 g tablet  3 times daily with meals & bedtime     07/24/17 1104     Ralene Ok, MD PGY 2 FM   Sela Hilding, MD 07/24/17 1115    Tanna Furry, MD 08/05/17 1455

## 2017-07-24 NOTE — Discharge Instructions (Signed)
Your pain is likely due to irritation within your stomach.  Use the Carafate dissolved in water prior to meals, and take Zantac twice per day if pain continues.  Please follow-up with your regular doctor to consider further treatment for this, return if abdominal pain worsens or you feel unwell or have other concerns.

## 2017-07-24 NOTE — ED Notes (Signed)
ED Provider at bedside. 

## 2017-11-14 ENCOUNTER — Emergency Department (HOSPITAL_BASED_OUTPATIENT_CLINIC_OR_DEPARTMENT_OTHER): Payer: Medicare Other

## 2017-11-14 ENCOUNTER — Emergency Department (HOSPITAL_BASED_OUTPATIENT_CLINIC_OR_DEPARTMENT_OTHER)
Admission: EM | Admit: 2017-11-14 | Discharge: 2017-11-15 | Disposition: A | Discharge status: Home / Self Care | Payer: Medicare Other | Attending: Emergency Medicine | Admitting: Emergency Medicine

## 2017-11-14 ENCOUNTER — Other Ambulatory Visit: Payer: Self-pay

## 2017-11-14 ENCOUNTER — Encounter (HOSPITAL_BASED_OUTPATIENT_CLINIC_OR_DEPARTMENT_OTHER): Payer: Self-pay | Admitting: Emergency Medicine

## 2017-11-14 DIAGNOSIS — Y939 Activity, unspecified: Secondary | ICD-10-CM | POA: Insufficient documentation

## 2017-11-14 DIAGNOSIS — Y929 Unspecified place or not applicable: Secondary | ICD-10-CM | POA: Insufficient documentation

## 2017-11-14 DIAGNOSIS — S90412A Abrasion, left great toe, initial encounter: Secondary | ICD-10-CM | POA: Diagnosis not present

## 2017-11-14 DIAGNOSIS — Y999 Unspecified external cause status: Secondary | ICD-10-CM | POA: Diagnosis not present

## 2017-11-14 DIAGNOSIS — Z79899 Other long term (current) drug therapy: Secondary | ICD-10-CM | POA: Diagnosis not present

## 2017-11-14 DIAGNOSIS — S20212A Contusion of left front wall of thorax, initial encounter: Secondary | ICD-10-CM | POA: Diagnosis not present

## 2017-11-14 DIAGNOSIS — S79911A Unspecified injury of right hip, initial encounter: Secondary | ICD-10-CM | POA: Diagnosis present

## 2017-11-14 DIAGNOSIS — S7001XA Contusion of right hip, initial encounter: Secondary | ICD-10-CM | POA: Diagnosis not present

## 2017-11-14 DIAGNOSIS — I10 Essential (primary) hypertension: Secondary | ICD-10-CM | POA: Diagnosis not present

## 2017-11-14 DIAGNOSIS — W010XXA Fall on same level from slipping, tripping and stumbling without subsequent striking against object, initial encounter: Secondary | ICD-10-CM | POA: Insufficient documentation

## 2017-11-14 MED ORDER — METHOCARBAMOL 750 MG PO TABS: 750.0000 mg | ORAL_TABLET | Freq: Three times a day (TID) | ORAL | 0 refills | Status: AC | PRN

## 2017-11-14 NOTE — ED Notes (Signed)
Patient transported to X-ray 

## 2017-11-14 NOTE — ED Provider Notes (Signed)
[facility] EMERGENCY DEPARTMENT Provider Note   CSN: [id number] Arrival date & time: 11/14/17  2057     History   Chief Complaint Chief Complaint  Patient presents with  . Fall    HPI Marilyn Estrada Strength is a 72 y.o. female.  Patient s/p fall yesterday. States slipped on some water, and fell onto right hip and left lower chest wall. Abrasion to left great toe. Denies any faintness or dizziness prior to fall. No head injury or loc. No headache. Denies neck pain. States hx lumbar surgery in past, mild right lumbar pain and wants that area imaged as well. Right hip and left lower chest wall pain moderate, dull, worse w certain movements and palpation. No sob. No abd pain. Has been ambulatory post fall. No numbness/weakness. Superficial abrasions to left great toe - pt indicates tetanus imm up to date within past 5 years. No anticoag use.   The history is provided by the patient.  Fall  Pertinent negatives include no abdominal pain, no headaches and no shortness of breath.    Past Medical History:  Diagnosis Date  . Arthritis   . Hypertension   . Hypoglycemia     There are no active problems to display for this patient.   Past Surgical History:  Procedure Laterality Date  . ABDOMINAL HYSTERECTOMY    . BACK SURGERY    . bladder tack    . CHOLECYSTECTOMY    . GASTRIC BYPASS    . NECK SURGERY    . TONSILLECTOMY       OB History   None      Home Medications    Prior to Admission medications   Medication Sig Start Date End Date Taking? Authorizing Provider  albuterol (PROVENTIL HFA;VENTOLIN HFA) 108 (90 Base) MCG/ACT inhaler Inhale into the lungs.    [provider]  Cholecalciferol (VITAMIN D PO) Take by mouth.    [provider]  EPINEPHrine 0.3 mg/0.3 mL IJ SOAJ injection Inject 0.3 mg into the muscle.    [provider]  Esomeprazole Magnesium (NEXIUM PO) Take by mouth.    [provider]  Fe Fum-FePoly-Vit C-Vit B3  (INTEGRA PO) Take by mouth.    [provider]  HYDROcodone-acetaminophen (NORCO) 5-325 MG tablet Take 1 tablet by mouth every 4 (four) hours as needed (for pain; may cause constipation). Patient not taking: Reported on 05/12/2016 05/22/15   Molpus, Jenny Reichmann, MD  meclizine (ANTIVERT) 25 MG tablet Take 1 tablet (25 mg total) by mouth 3 (three) times daily as needed for dizziness. Patient not taking: Reported on 05/12/2016 05/06/13   Veryl Speak, MD  methocarbamol (ROBAXIN) 750 MG tablet Take 1 tablet (750 mg total) by mouth every 8 (eight) hours as needed for muscle spasms. 11/14/17   Lajean Saver, MD  ranitidine (ZANTAC) 75 MG tablet Take 1 tablet (75 mg total) by mouth 2 (two) times daily. 07/24/17   Sela Hilding, MD  sucralfate (CARAFATE) 1 g tablet Take 1 tablet (1 g total) by mouth 4 (four) times daily -  with meals and at bedtime. Mix with 1c water before using. 07/24/17   Sela Hilding, MD  triamterene-hydrochlorothiazide [facility]) 37.5-25 MG tablet  04/02/16   [provider]  UNKNOWN TO PATIENT     [provider]  Wheat Dextrin (BENEFIBER) POWD Take 3 g by mouth.    [provider]    Family History History reviewed. No pertinent family history.  Social History Social History  Tobacco Use  . Smoking status: Never Smoker  . Smokeless tobacco: Never Used  Substance Use Topics  . Alcohol use: No  . Drug use: No     Allergies   Bee venom; Sulfa antibiotics; Tramadol; and Shellfish-derived products   Review of Systems Review of Systems  Constitutional: Negative for fever.  HENT: Negative for nosebleeds.   Eyes: Negative for pain and visual disturbance.  Respiratory: Negative for shortness of breath.   Cardiovascular: Negative for palpitations.  Gastrointestinal: Negative for abdominal pain, nausea and vomiting.  Genitourinary: Negative for flank pain.  Musculoskeletal: Negative for neck pain.  Skin: Negative for rash.    Neurological: Negative for weakness, numbness and headaches.  Hematological: Does not bruise/bleed easily.  Psychiatric/Behavioral: Negative for confusion.     Physical Exam Updated Vital Signs BP 131/73 (BP Location: Left Arm)   Pulse 61   Temp 98.2 F (36.8 C) (Oral)   Resp 16   Ht 1.588 m (5' 2.5")   Wt 103.4 kg   SpO2 97%   BMI 41.04 kg/m   Physical Exam  Constitutional: She appears well-developed and well-nourished.  HENT:  Mouth/Throat: Oropharynx is clear and moist.  Eyes: Conjunctivae are normal. No scleral icterus.  Neck: Normal range of motion. Neck supple. No tracheal deviation present.  Cardiovascular: Normal rate, regular rhythm, normal heart sounds and intact distal pulses.  Pulmonary/Chest: Effort normal and breath sounds normal. No respiratory distress.  Left lower chest wall tenderness reproducing symptoms. Normal chest wall movement. No crepitus.   Abdominal: Soft. Normal appearance and bowel sounds are normal. She exhibits no distension.  No abd wall tenderness, bruising, or contusion.   Musculoskeletal: She exhibits no edema.  Mild lumbar tenderness, otherwise, CTLS spine, non tender, aligned, no step off. Tenderness right hip, otherwise good rom bil ext without pain or other focal bony tenderness. Abrasions left great toe, no focal bony tenderness, no deformity, normal cap refill distally.   Neurological: She is alert.  Speech clear/fluent. Alert, oriented. Motor/sens grossly intact bil. Patient ambulates w steady gait.   Skin: Skin is warm and dry. No rash noted.  Psychiatric: She has a normal mood and affect.  Nursing note and vitals reviewed.    ED Treatments / Results  Labs (all labs ordered are listed, but only abnormal results are displayed) Labs Reviewed - No data to display  EKG None  Radiology Dg Ribs Unilateral W/chest Left  Result Date: 11/14/2017 CLINICAL DATA:  Fall with left rib pain. EXAM: LEFT RIBS AND CHEST - 3+ VIEW  COMPARISON:  None. FINDINGS: No fracture or other bone lesions are seen involving the ribs. Soft tissue attenuation from habitus partially obscures evaluation of the lower ribs. Elevation of right hemidiaphragm, which was seen on abdominal CT 02/25/2010. There is no evidence of pneumothorax or pleural effusion. Subsegmental atelectasis at the left lung base. Heart size and mediastinal contours are within normal limits. IMPRESSION: No left rib fracture or pulmonary complication. Electronically Signed   By: Keith Rake M.D.   On: 11/14/2017 23:27   Dg Lumbar Spine Complete  Result Date: 11/14/2017 CLINICAL DATA:  Fall yesterday with right hip pain, lumbosacral back pain, left rib pain. EXAM: LUMBAR SPINE - COMPLETE 4+ VIEW COMPARISON:  None. FINDINGS: Posterior L5-S1 fusion with intact hardware. The alignment is maintained. Vertebral body heights are normal. There is no listhesis. The posterior elements are intact. Mild endplate spurring at multiple levels with preservation of disc spaces. No fracture. Sacroiliac joints are congruent. Surgical clips  post cholecystectomy, enteric staples post gastric bypass. IMPRESSION: 1. No fracture of the lumbar spine. 2. Postsurgical change with mild spondylosis. Electronically Signed   By: Keith Rake M.D.   On: 11/14/2017 23:23   Dg Hip Unilat W Or W/o Pelvis 2-3 Views Right  Result Date: 11/14/2017 CLINICAL DATA:  Fall yesterday with right hip pain. EXAM: DG HIP (WITH OR WITHOUT PELVIS) 2-3V RIGHT COMPARISON:  None. FINDINGS: The cortical margins of the bony pelvis are intact. No fracture. Hypertrophic changes about the trochanters bilaterally. Pubic symphysis and sacroiliac joints are congruent. Both femoral heads are well-seated in the respective acetabula. Mild bilateral acetabular spurring. IMPRESSION: No fracture of the pelvis or right hip. Electronically Signed   By: Keith Rake M.D.   On: 11/14/2017 23:25    Procedures Procedures (including  critical care time)  Medications Ordered in ED Medications - No data to display   Initial Impression / Assessment and Plan / ED Course  I have reviewed the triage vital signs and the nursing notes.  Pertinent labs & imaging results that were available during my care of the patient were reviewed by me and considered in my medical decision making (see chart for details).  Imaging ordered.   Acetaminophen po.  Reviewed nursing notes and prior charts for additional history.   Abrasion to toe - wound care. Pt affirms tetanus imm up to date.   xrays reviewed - no fx. Discussed w pt.   rx for home provided.     Final Clinical Impressions(s) / ED Diagnoses   Final diagnoses:  Fall from slip, trip, or stumble, initial encounter  Contusion of right hip, initial encounter  Contusion of left chest wall, initial encounter  Abrasion of left great toe, initial encounter    ED Discharge Orders         Ordered    methocarbamol (ROBAXIN) 750 MG tablet  Every 8 hours PRN     11/14/17 2308           Lajean Saver, MD 11/14/17 2331

## 2017-11-14 NOTE — ED Triage Notes (Signed)
Pt states she slipped on some water and fell back onto her right hip and right arm  Pt states on the sidewalk  Pt is c/o left great toe pain, right hip pain, lower back pain that is radiating across to her right side and up under her right breast area  Pt states the incident happened yesterday

## 2017-11-14 NOTE — ED Notes (Signed)
Pt understood dc material. NAD noted. Script given at dc  

## 2017-11-14 NOTE — Discharge Instructions (Addendum)
It was our pleasure to provide your ER care today - we hope that you feel better.  Take acetaminophen and/or ibuprofen as need for pain. You may take robaxin as need for muscle spasm - may make drowsy, no driving when taking.   Follow up with primary care doctor in 1-2 weeks if symptoms fail to improve/resolve.  Return to ER if worse, new symptoms, new or severe pain, other concern.

## 2018-12-18 ENCOUNTER — Emergency Department (HOSPITAL_BASED_OUTPATIENT_CLINIC_OR_DEPARTMENT_OTHER): Payer: Medicare Other

## 2018-12-18 ENCOUNTER — Emergency Department (HOSPITAL_BASED_OUTPATIENT_CLINIC_OR_DEPARTMENT_OTHER)
Admission: EM | Admit: 2018-12-18 | Discharge: 2018-12-18 | Disposition: A | Discharge status: Home / Self Care | Payer: Medicare Other | Attending: Emergency Medicine | Admitting: Emergency Medicine

## 2018-12-18 ENCOUNTER — Other Ambulatory Visit: Payer: Self-pay

## 2018-12-18 ENCOUNTER — Encounter (HOSPITAL_BASED_OUTPATIENT_CLINIC_OR_DEPARTMENT_OTHER): Payer: Self-pay | Admitting: Emergency Medicine

## 2018-12-18 DIAGNOSIS — I10 Essential (primary) hypertension: Secondary | ICD-10-CM | POA: Insufficient documentation

## 2018-12-18 DIAGNOSIS — Z79899 Other long term (current) drug therapy: Secondary | ICD-10-CM | POA: Insufficient documentation

## 2018-12-18 DIAGNOSIS — M79674 Pain in right toe(s): Secondary | ICD-10-CM | POA: Diagnosis present

## 2018-12-18 NOTE — Discharge Instructions (Addendum)
Please use Epson salt hot water soaks, keep area clean, please return if any signs of infection including redness, swelling, pus or increased pain.  Tylenol and ibuprofen for pain

## 2018-12-18 NOTE — ED Triage Notes (Signed)
Pt stepped on glass today. C/o pain to R great toe. Puncture wound noted.

## 2018-12-18 NOTE — ED Provider Notes (Signed)
[facility]PART[facility] Provider Note   CSN: [id number] Arrival date & time: 12/18/18  1744     History   Chief Complaint Chief Complaint  Patient presents with  . Foot Injury    HPI Marilyn Estrada is a 73 y.o. female      HPI  Patient states prior to arrival she stepped on piece of broken glass and is concerned that she has small piece of glass in her right great toe.  Patient states that she did not sleep is elastic in her foot but feels a sensation of something in her toe.  Patient denies any bleeding or obvious injury or stepping on glass.  However, her daughter made a small cut to her right great toe on the bottom in order to look for a foreign body.  None was found and patient presented to ED for further evaluation.  Patient states she has monitor, constant, stinging pain to the bottom part of the right great toe with foreign body sensation, no bleeding, bruising.   Patient denies any history of diabetes, HIV, immunocompromise   Past Medical History:  Diagnosis Date  . Arthritis   . Hypertension   . Hypoglycemia     There are no active problems to display for this patient.   Past Surgical History:  Procedure Laterality Date  . ABDOMINAL HYSTERECTOMY    . BACK SURGERY    . bladder tack    . CHOLECYSTECTOMY    . GASTRIC BYPASS    . NECK SURGERY    . TONSILLECTOMY       OB History   No obstetric history on file.      Home Medications    Prior to Admission medications   Medication Sig Start Date End Date Taking? Authorizing Provider  albuterol (PROVENTIL HFA;VENTOLIN HFA) 108 (90 Base) MCG/ACT inhaler Inhale into the lungs.    [provider]  Cholecalciferol (VITAMIN D PO) Take by mouth.    [provider]  EPINEPHrine 0.3 mg/0.3 mL IJ SOAJ injection Inject 0.3 mg into the muscle.    [provider]  Esomeprazole Magnesium (NEXIUM PO) Take by mouth.    [provider]  Fe Fum-FePoly-Vit C-Vit B3  (INTEGRA PO) Take by mouth.    [provider]  HYDROcodone-acetaminophen (NORCO) 5-325 MG tablet Take 1 tablet by mouth every 4 (four) hours as needed (for pain; may cause constipation). Patient not taking: Reported on 05/12/2016 05/22/15   Molpus, Jenny Reichmann, MD  meclizine (ANTIVERT) 25 MG tablet Take 1 tablet (25 mg total) by mouth 3 (three) times daily as needed for dizziness. Patient not taking: Reported on 05/12/2016 05/06/13   Veryl Speak, MD  methocarbamol (ROBAXIN) 750 MG tablet Take 1 tablet (750 mg total) by mouth every 8 (eight) hours as needed for muscle spasms. 11/14/17   Lajean Saver, MD  ranitidine (ZANTAC) 75 MG tablet Take 1 tablet (75 mg total) by mouth 2 (two) times daily. 07/24/17   Glenis Smoker, MD  sucralfate (CARAFATE) 1 g tablet Take 1 tablet (1 g total) by mouth 4 (four) times daily -  with meals and at bedtime. Mix with 1c water before using. 07/24/17   Glenis Smoker, MD  triamterene-hydrochlorothiazide [facility]) 37.5-25 MG tablet  04/02/16   [provider]  UNKNOWN TO PATIENT     [provider]  Wheat Dextrin (BENEFIBER) POWD Take 3 g by mouth.    [provider]    Family History No family  history on file.  Social History Social History   Tobacco Use  . Smoking status: Never Smoker  . Smokeless tobacco: Never Used  Substance Use Topics  . Alcohol use: No  . Drug use: No     Allergies   Bee venom, Sulfa antibiotics, Tramadol, and Shellfish-derived products   Review of Systems Review of Systems  Constitutional: Negative for fever.  Respiratory: Negative for cough.   Gastrointestinal: Negative for diarrhea.  Musculoskeletal: Negative for back pain.  Skin: Positive for wound.     Physical Exam Updated Vital Signs BP (!) 180/85 (BP Location: Left Arm)   Pulse 74   Temp 98.1 F (36.7 C) (Oral)   Resp 20   Ht 5\' 2"  (1.575 m)   Wt 101.6 kg   SpO2 97%   BMI 40.97 kg/m   Physical Exam Vitals signs  and nursing note reviewed.  Constitutional:      General: She is not in acute distress.    Appearance: Normal appearance. She is not ill-appearing.  HENT:     Head: Normocephalic and atraumatic.  Eyes:     General: No scleral icterus.       Right eye: No discharge.        Left eye: No discharge.     Conjunctiva/sclera: Conjunctivae normal.  Pulmonary:     Effort: Pulmonary effort is normal.     Breath sounds: No stridor.  Skin:    Comments: Patient with tiny 0.5 mm in diameter self-inflicted puncture wound on the right great toe, plantar aspect There is not bleeding.  Tenderness to palpation over puncture. No obvious foreign body, close inspection there is no evidence of foreign body with transillumination.  Neurological:     Mental Status: She is alert and oriented to person, place, and time. Mental status is at baseline.      ED Treatments / Results  Labs (all labs ordered are listed, but only abnormal results are displayed) Labs Reviewed - No data to display  EKG None  Radiology Dg Toe Great Right  Result Date: 12/18/2018 CLINICAL DATA:  Possible foreign body, stepped on glass EXAM: RIGHT GREAT TOE COMPARISON:  None. FINDINGS: No fracture or dislocation of the right great toe. No obvious radiopaque foreign body identified, however lateral view provided is very rotated and further, small fragments of household glass may be poorly radiopaque. Soft tissue edema about the toe. Mild right first metatarsophalangeal arthrosis. IMPRESSION: No fracture or dislocation of the right great toe. No obvious radiopaque foreign body identified, however lateral view provided is very rotated and further, small fragments of household glass may be poorly radiopaque. Soft tissue edema about the toe. Electronically Signed   By: Eddie Candle M.D.   On: 12/18/2018 18:31    Procedures Procedures (including critical care time)  Medications Ordered in ED Medications - No data to display   Initial  Impression / Assessment and Plan / ED Course  I have reviewed the triage vital signs and the nursing notes.  Pertinent labs & imaging results that were available during my care of the patient were reviewed by me and considered in my medical decision making (see chart for details).        Patient is well-appearing 73 year old female who stepped on a piece of glass earlier today and is concerned that she has retained foreign body.  X-ray shows no foreign body however the study may miss small piece of glass which is what patient reported.  However on my close  special transillumination did not see any foreign body and have low suspicion for concerning retained foreign body as patient has no deep wounds.  However patient continues to have foreign body sensation.  Discussed pros and cons of cutting into her great toe in order to explore area.  Discussed with patient best plan of action which would be to discharge her home and have her do warm Epson salt soaks.  Agreeable to plan in no acute pain appears well will use Tylenol Profen for pain.  Final Clinical Impressions(s) / ED Diagnoses   Final diagnoses:  Great toe pain, right    ED Discharge Orders    None       Tedd Sias, [place] 12/18/18 2202    Quintella Reichert, MD 12/19/18 0023

## 2019-03-08 ENCOUNTER — Other Ambulatory Visit: Payer: Self-pay | Admitting: *Deleted

## 2019-03-08 DIAGNOSIS — Z20822 Contact with and (suspected) exposure to covid-19: Secondary | ICD-10-CM

## 2019-03-09 LAB — NOVEL CORONAVIRUS, NAA: SARS-CoV-2, NAA: NOT DETECTED

## 2019-10-05 ENCOUNTER — Other Ambulatory Visit: Payer: Self-pay

## 2019-10-05 ENCOUNTER — Emergency Department (HOSPITAL_BASED_OUTPATIENT_CLINIC_OR_DEPARTMENT_OTHER)
Admission: EM | Admit: 2019-10-05 | Discharge: 2019-10-05 | Disposition: A | Discharge status: Home / Self Care | Payer: Medicare Other | Attending: Emergency Medicine | Admitting: Emergency Medicine

## 2019-10-05 ENCOUNTER — Encounter (HOSPITAL_BASED_OUTPATIENT_CLINIC_OR_DEPARTMENT_OTHER): Payer: Self-pay | Admitting: Emergency Medicine

## 2019-10-05 DIAGNOSIS — Z79899 Other long term (current) drug therapy: Secondary | ICD-10-CM | POA: Insufficient documentation

## 2019-10-05 DIAGNOSIS — K5900 Constipation, unspecified: Secondary | ICD-10-CM | POA: Diagnosis not present

## 2019-10-05 DIAGNOSIS — K649 Unspecified hemorrhoids: Secondary | ICD-10-CM

## 2019-10-05 DIAGNOSIS — I10 Essential (primary) hypertension: Secondary | ICD-10-CM | POA: Insufficient documentation

## 2019-10-05 HISTORY — DX: Gastro-esophageal reflux disease without esophagitis: K21.9

## 2019-10-05 MED ORDER — HYDROCORT-PRAMOXINE (PERIANAL) 1-1 % EX FOAM: 1.0000 | Freq: Three times a day (TID) | CUTANEOUS | 0 refills | Status: AC

## 2019-10-05 NOTE — ED Triage Notes (Signed)
C/o painful hemorrhoids for 5 days. Tried OTC meds without relief.

## 2019-10-05 NOTE — Discharge Instructions (Addendum)
Use MiraLAX as discussed.

## 2019-10-05 NOTE — ED Provider Notes (Signed)
[facility] Provider Note   CSN: [id number] Arrival date & time: 10/05/19  0800     History Chief Complaint  Patient presents with  . Hemorrhoids    KEYSI OELKERS is a 74 y.o. female.  The history is provided by the patient.  Illness Location:  Rectum Severity:  Mild Onset quality:  Gradual Timing:  Constant Progression:  Unchanged Chronicity:  New Context:  Patient with hemorrhoid Relieved by:  Nothing Worsened by:  Nothing Associated symptoms: no abdominal pain, no diarrhea, no fatigue, no fever, no nausea and no vomiting        Past Medical History:  Diagnosis Date  . Arthritis   . GERD (gastroesophageal reflux disease)   . Hypertension   . Hypoglycemia     There are no problems to display for this patient.   Past Surgical History:  Procedure Laterality Date  . ABDOMINAL HYSTERECTOMY    . BACK SURGERY    . bladder tack    . CHOLECYSTECTOMY    . GASTRIC BYPASS    . NECK SURGERY    . TONSILLECTOMY       OB History   No obstetric history on file.     No family history on file.  Social History   Tobacco Use  . Smoking status: Never Smoker  . Smokeless tobacco: Never Used  Substance Use Topics  . Alcohol use: No  . Drug use: No    Home Medications Prior to Admission medications   Medication Sig Start Date End Date Taking? Authorizing Provider  amLODipine (NORVASC) 10 MG tablet Take 10 mg by mouth daily.   Yes [provider]  famotidine (PEPCID) 20 MG tablet Take 20 mg by mouth 2 (two) times daily.   Yes [provider]  omeprazole (PRILOSEC) 20 MG capsule Take 20 mg by mouth daily.   Yes [provider]  albuterol (PROVENTIL HFA;VENTOLIN HFA) 108 (90 Base) MCG/ACT inhaler Inhale into the lungs.    [provider]  Cholecalciferol (VITAMIN D PO) Take by mouth.    [provider]  EPINEPHrine 0.3 mg/0.3 mL IJ SOAJ injection Inject 0.3 mg into the muscle.    [provider]  Esomeprazole Magnesium (NEXIUM PO) Take by mouth.    [provider]  Fe Fum-FePoly-Vit C-Vit B3 (INTEGRA PO) Take by mouth.    [provider]  HYDROcodone-acetaminophen (NORCO) 5-325 MG tablet Take 1 tablet by mouth every 4 (four) hours as needed (for pain; may cause constipation). Patient not taking: Reported on 05/12/2016 05/22/15   Molpus, John, MD  hydrocortisone-pramoxine [facility]) rectal foam Place 1 applicator rectally 3 (three) times daily for 13 days. 10/05/19 10/18/19  Elleanor Guyett, DO  meclizine (ANTIVERT) 25 MG tablet Take 1 tablet (25 mg total) by mouth 3 (three) times daily as needed for dizziness. Patient not taking: Reported on 05/12/2016 05/06/13   Veryl Speak, MD  methocarbamol (ROBAXIN) 750 MG tablet Take 1 tablet (750 mg total) by mouth every 8 (eight) hours as needed for muscle spasms. 11/14/17   Lajean Saver, MD  ranitidine (ZANTAC) 75 MG tablet Take 1 tablet (75 mg total) by mouth 2 (two) times daily. 07/24/17   Glenis Smoker, MD  sucralfate (CARAFATE) 1 g tablet Take 1 tablet (1 g total) by mouth 4 (four) times daily -  with meals and at bedtime. Mix with 1c water before using. 07/24/17   Glenis Smoker, MD  triamterene-hydrochlorothiazide (MAXZIDE-25) 37.5-25 MG tablet  04/02/16   [provider]  UNKNOWN TO PATIENT     [provider]  Wheat Dextrin (BENEFIBER) POWD Take 3 g by mouth.    [provider]    Allergies    Bee venom, Sulfa antibiotics, Tramadol, and Shellfish-derived products  Review of Systems   Review of Systems  Constitutional: Negative for fatigue and fever.  Gastrointestinal: Positive for constipation. Negative for abdominal distention, abdominal pain, anal bleeding, blood in stool, diarrhea, nausea, rectal pain and vomiting.  Skin: Negative for color change, pallor and wound.    Physical Exam Updated Vital Signs BP (!) 154/108 (BP Location: Left Arm) Comment: Pt has not  taken her BP medication this morning  Pulse 97   Temp 98.4 F (36.9 C) (Oral)   Resp 20   Ht 5\' 2"  (1.575 m)   Wt 98 kg   SpO2 100%   BMI 39.51 kg/m   Physical Exam Vitals reviewed.  Constitutional:      General: She is not in acute distress.    Appearance: She is not ill-appearing.  Cardiovascular:     Pulses: Normal pulses.  Genitourinary:    Comments: 1 nonthrombosed hemorrhoid Skin:    General: Skin is warm.     Capillary Refill: Capillary refill takes less than 2 seconds.  Neurological:     Mental Status: She is alert.     ED Results / Procedures / Treatments   Labs (all labs ordered are listed, but only abnormal results are displayed) Labs Reviewed - No data to display  EKG None  Radiology No results found.  Procedures Procedures (including critical care time)  Medications Ordered in ED Medications - No data to display  ED Course  I have reviewed the triage vital signs and the nursing notes.  Pertinent labs & imaging results that were available during my care of the patient were reviewed by me and considered in my medical decision making (see chart for details).    MDM Rules/Calculators/A&P                          ILARIA MUCH is a 74 year old female with no significant medical history presents the ED with hemorrhoid.  Normal vitals.  No fever.  No rectal bleeding.  Has tried some over-the-counter pain medications without much relief.  Patient has 1 large nonthrombosed hemorrhoid on exam.  Will give Proctofoam and MiraLAX as patient has been constipated and suspect that is the cause of her hemorrhoid.  Recommend follow-up with primary care doctor and discharged in ED in good condition.  This chart was dictated using voice recognition software.  Despite best efforts to proofread,  errors can occur which can change the documentation meaning.   Final Clinical Impression(s) / ED Diagnoses Final diagnoses:  Hemorrhoids, unspecified hemorrhoid type     Rx / DC Orders ED Discharge Orders         Ordered    hydrocortisone-pramoxine [facility]) rectal foam  3 times daily        10/05/19 0827           Lennice Sites, DO 10/05/19 [id number]

## 2019-12-01 ENCOUNTER — Emergency Department (HOSPITAL_BASED_OUTPATIENT_CLINIC_OR_DEPARTMENT_OTHER)
Admission: EM | Admit: 2019-12-01 | Discharge: 2019-12-01 | Disposition: A | Discharge status: Home / Self Care | Payer: Medicare Other | Attending: Emergency Medicine | Admitting: Emergency Medicine

## 2019-12-01 ENCOUNTER — Encounter (HOSPITAL_BASED_OUTPATIENT_CLINIC_OR_DEPARTMENT_OTHER): Payer: Self-pay

## 2019-12-01 ENCOUNTER — Other Ambulatory Visit: Payer: Self-pay

## 2019-12-01 ENCOUNTER — Emergency Department (HOSPITAL_BASED_OUTPATIENT_CLINIC_OR_DEPARTMENT_OTHER): Payer: Medicare Other

## 2019-12-01 DIAGNOSIS — R42 Dizziness and giddiness: Secondary | ICD-10-CM | POA: Insufficient documentation

## 2019-12-01 DIAGNOSIS — Z79899 Other long term (current) drug therapy: Secondary | ICD-10-CM | POA: Insufficient documentation

## 2019-12-01 DIAGNOSIS — R109 Unspecified abdominal pain: Secondary | ICD-10-CM | POA: Diagnosis present

## 2019-12-01 DIAGNOSIS — R1032 Left lower quadrant pain: Secondary | ICD-10-CM | POA: Insufficient documentation

## 2019-12-01 DIAGNOSIS — R110 Nausea: Secondary | ICD-10-CM | POA: Insufficient documentation

## 2019-12-01 DIAGNOSIS — I10 Essential (primary) hypertension: Secondary | ICD-10-CM | POA: Insufficient documentation

## 2019-12-01 DIAGNOSIS — K219 Gastro-esophageal reflux disease without esophagitis: Secondary | ICD-10-CM | POA: Insufficient documentation

## 2019-12-01 LAB — COMPREHENSIVE METABOLIC PANEL
ALT: 13 U/L (ref 0–44)
AST: 16 U/L (ref 15–41)
Albumin: 3.6 g/dL (ref 3.5–5.0)
Alkaline Phosphatase: 127 U/L — ABNORMAL HIGH (ref 38–126)
Anion gap: 9 (ref 5–15)
BUN: 11 mg/dL (ref 8–23)
CO2: 24 mmol/L (ref 22–32)
Calcium: 8.5 mg/dL — ABNORMAL LOW (ref 8.9–10.3)
Chloride: 109 mmol/L (ref 98–111)
Creatinine, Ser: 0.62 mg/dL (ref 0.44–1.00)
GFR, Estimated: >60 mL/min (ref >60)
Glucose, Bld: 90 mg/dL (ref 70–99)
Potassium: 4.1 mmol/L (ref 3.5–5.1)
Sodium: 142 mmol/L (ref 135–145)
Total Bilirubin: 0.2 mg/dL — ABNORMAL LOW (ref 0.3–1.2)
Total Protein: 6.7 g/dL (ref 6.5–8.1)

## 2019-12-01 LAB — CBC
HCT: 37.3 % (ref 36.0–46.0)
Hemoglobin: 11.4 g/dL — ABNORMAL LOW (ref 12.0–15.0)
MCH: 25.3 pg — ABNORMAL LOW (ref 26.0–34.0)
MCHC: 30.6 g/dL (ref 30.0–36.0)
MCV: 82.7 fL (ref 80.0–100.0)
Platelets: 251 10*3/uL (ref 150–400)
RBC: 4.51 MIL/uL (ref 3.87–5.11)
RDW: 14.9 % (ref 11.5–15.5)
WBC: 5.8 10*3/uL (ref 4.0–10.5)
nRBC: 0 % (ref 0.0–0.2)

## 2019-12-01 LAB — URINALYSIS, ROUTINE W REFLEX MICROSCOPIC
Bilirubin Urine: NEGATIVE
Glucose, UA: NEGATIVE mg/dL
Hgb urine dipstick: NEGATIVE
Ketones, ur: NEGATIVE mg/dL
Leukocytes,Ua: NEGATIVE
Nitrite: NEGATIVE
Protein, ur: NEGATIVE mg/dL
Specific Gravity, Urine: 1.025 (ref 1.005–1.030)
pH: 6 (ref 5.0–8.0)

## 2019-12-01 LAB — LIPASE, BLOOD: Lipase: 26 U/L (ref 11–51)

## 2019-12-01 MED ORDER — IOHEXOL 300 MG/ML  SOLN
100.0000 mL | Freq: Once | INTRAMUSCULAR | Status: AC | PRN
  Administered 2019-12-01: 100 mL via INTRAVENOUS

## 2019-12-01 MED ORDER — ONDANSETRON HCL 4 MG PO TABS: 4.0000 mg | ORAL_TABLET | Freq: Three times a day (TID) | ORAL | 0 refills | Status: AC | PRN

## 2019-12-01 MED ORDER — OXYCODONE-ACETAMINOPHEN 5-325 MG PO TABS
2.0000 | ORAL_TABLET | ORAL | 0 refills | Status: AC | PRN
Start: 2019-12-01 — End: ?

## 2019-12-01 MED ORDER — SODIUM CHLORIDE 0.9 % IV BOLUS
500.0000 mL | Freq: Once | INTRAVENOUS | Status: AC
  Administered 2019-12-01: 500 mL via INTRAVENOUS

## 2019-12-01 MED ORDER — MORPHINE SULFATE (PF) 4 MG/ML IV SOLN
4.0000 mg | Freq: Once | INTRAVENOUS | Status: AC
  Administered 2019-12-01: 4 mg via INTRAVENOUS
  Filled 2019-12-01: qty 1

## 2019-12-01 NOTE — ED Notes (Signed)
Discharge instructions discussed with patient. Follow up care and medication understanding verbalized. Departs ED at this time in stable condition. Pain under control.

## 2019-12-01 NOTE — ED Notes (Signed)
Assumed care of patient from Andrea, RN.

## 2019-12-01 NOTE — ED Triage Notes (Addendum)
Pt c/o dizziness x 1 month-also c/o lower left abd pain x 5-6 months-states she has not sought medical tx for dizziness-states she was seen by PCP for abd pain with no dx-also c/o blood in urine x 5 days-NAD-steady gait

## 2019-12-01 NOTE — ED Provider Notes (Signed)
[facility] EMERGENCY DEPARTMENT Provider Note   CSN: [id number] Arrival date & time: 12/01/19  1319     History Chief Complaint  Patient presents with  . Dizziness  . Abdominal Pain    Marilyn Estrada is a 74 y.o. female.  The history is provided by the patient, medical records and the spouse. No language interpreter was used.  Flank Pain This is a chronic problem. The current episode started more than 1 week ago. The problem occurs constantly. The problem has been gradually worsening. Associated symptoms include abdominal pain. Pertinent negatives include no chest pain, no headaches and no shortness of breath. Nothing aggravates the symptoms. Nothing relieves the symptoms. She has tried nothing for the symptoms. The treatment provided no relief.       Past Medical History:  Diagnosis Date  . Arthritis   . GERD (gastroesophageal reflux disease)   . Hypertension   . Hypoglycemia     There are no problems to display for this patient.   Past Surgical History:  Procedure Laterality Date  . ABDOMINAL HYSTERECTOMY    . BACK SURGERY    . bladder tack    . CHOLECYSTECTOMY    . GASTRIC BYPASS    . NECK SURGERY    . TONSILLECTOMY       OB History   No obstetric history on file.     No family history on file.  Social History   Tobacco Use  . Smoking status: Never Smoker  . Smokeless tobacco: Never Used  Vaping Use  . Vaping Use: Never used  Substance Use Topics  . Alcohol use: No  . Drug use: No    Home Medications Prior to Admission medications   Medication Sig Start Date End Date Taking? Authorizing Provider  albuterol (PROVENTIL HFA;VENTOLIN HFA) 108 (90 Base) MCG/ACT inhaler Inhale into the lungs.    [provider]  amLODipine (NORVASC) 10 MG tablet Take 10 mg by mouth daily.    [provider]  Cholecalciferol (VITAMIN D PO) Take by mouth.    [provider]  EPINEPHrine 0.3 mg/0.3 mL IJ SOAJ injection Inject 0.3  mg into the muscle.    [provider]  Esomeprazole Magnesium (NEXIUM PO) Take by mouth.    [provider]  famotidine (PEPCID) 20 MG tablet Take 20 mg by mouth 2 (two) times daily.    [provider]  Fe Fum-FePoly-Vit C-Vit B3 (INTEGRA PO) Take by mouth.    [provider]  HYDROcodone-acetaminophen (NORCO) 5-325 MG tablet Take 1 tablet by mouth every 4 (four) hours as needed (for pain; may cause constipation). Patient not taking: Reported on 05/12/2016 05/22/15   Molpus, Jenny Reichmann, MD  meclizine (ANTIVERT) 25 MG tablet Take 1 tablet (25 mg total) by mouth 3 (three) times daily as needed for dizziness. Patient not taking: Reported on 05/12/2016 05/06/13   Veryl Speak, MD  methocarbamol (ROBAXIN) 750 MG tablet Take 1 tablet (750 mg total) by mouth every 8 (eight) hours as needed for muscle spasms. 11/14/17   Lajean Saver, MD  omeprazole (PRILOSEC) 20 MG capsule Take 20 mg by mouth daily.    [provider]  ranitidine (ZANTAC) 75 MG tablet Take 1 tablet (75 mg total) by mouth 2 (two) times daily. 07/24/17   Glenis Smoker, MD  sucralfate (CARAFATE) 1 g tablet Take 1 tablet (1 g total) by mouth 4 (four) times daily -  with meals and at bedtime. Mix with 1c water before  using. 07/24/17   Glenis Smoker, MD  triamterene-hydrochlorothiazide [facility]) 37.5-25 MG tablet  04/02/16   [provider]  UNKNOWN TO PATIENT     [provider]  Wheat Dextrin (BENEFIBER) POWD Take 3 g by mouth.    [provider]    Allergies    Bee venom, Sulfa antibiotics, Tramadol, and Shellfish-derived products  Review of Systems   Review of Systems  Constitutional: Positive for fatigue. Negative for chills, diaphoresis and fever.  HENT: Negative for congestion.   Eyes: Negative for visual disturbance.  Respiratory: Negative for cough, chest tightness, shortness of breath and wheezing.   Cardiovascular: Negative for chest pain,  palpitations and leg swelling.  Gastrointestinal: Positive for abdominal pain, blood in stool and nausea. Negative for constipation, diarrhea and vomiting.  Genitourinary: Positive for dysuria and flank pain. Negative for frequency, vaginal bleeding and vaginal discharge.  Musculoskeletal: Positive for back pain. Negative for neck pain and neck stiffness.  Skin: Negative for rash and wound.  Neurological: Positive for light-headedness. Negative for syncope, weakness, numbness and headaches.  Psychiatric/Behavioral: Negative for agitation.  All other systems reviewed and are negative.   Physical Exam Updated Vital Signs BP (!) 166/82 (BP Location: Right Arm)   Pulse 69   Temp 98.8 F (37.1 C) (Oral)   Resp 18   Ht 5\' 2"  (1.575 m)   Wt 98.4 kg   SpO2 99%   BMI 39.69 kg/m   Physical Exam Vitals and nursing note reviewed.  Constitutional:      General: She is not in acute distress.    Appearance: She is well-developed. She is not ill-appearing, toxic-appearing or diaphoretic.  HENT:     Head: Normocephalic and atraumatic.     Right Ear: External ear normal.     Left Ear: External ear normal.     Nose: Nose normal.     Mouth/Throat:     Pharynx: No oropharyngeal exudate.  Eyes:     Conjunctiva/sclera: Conjunctivae normal.     Pupils: Pupils are equal, round, and reactive to light.  Cardiovascular:     Rate and Rhythm: Normal rate.     Heart sounds: Normal heart sounds. No murmur heard.   Pulmonary:     Effort: Pulmonary effort is normal. No respiratory distress.     Breath sounds: No stridor.  Abdominal:     General: Abdomen is flat. Bowel sounds are normal. There is no distension.     Tenderness: There is abdominal tenderness in the left lower quadrant. There is left CVA tenderness. There is no right CVA tenderness or rebound.  Musculoskeletal:     Cervical back: Normal range of motion and neck supple.  Skin:    General: Skin is warm.     Capillary Refill: Capillary  refill takes less than 2 seconds.     Findings: No erythema or rash.  Neurological:     General: No focal deficit present.     Mental Status: She is alert and oriented to person, place, and time.     Motor: No abnormal muscle tone.     Coordination: Coordination normal.     Deep Tendon Reflexes: Reflexes are normal and symmetric.  Psychiatric:        Mood and Affect: Mood normal.     ED Results / Procedures / Treatments   Labs (all labs ordered are listed, but only abnormal results are displayed) Labs Reviewed  COMPREHENSIVE METABOLIC PANEL - Abnormal; Notable for the following components:  Result Value   Calcium 8.5 (*)    Alkaline Phosphatase 127 (*)    Total Bilirubin 0.2 (*)    All other components within normal limits  CBC - Abnormal; Notable for the following components:   Hemoglobin 11.4 (*)    MCH 25.3 (*)    All other components within normal limits  URINE CULTURE  LIPASE, BLOOD  URINALYSIS, ROUTINE W REFLEX MICROSCOPIC    EKG EKG Interpretation  Date/Time:  Friday December 01 2019 13:32:07 EDT Ventricular Rate:  77 PR Interval:  166 QRS Duration: 68 QT Interval:  362 QTC Calculation: 409 R Axis:   10 Text Interpretation: Sinus rhythm with Premature atrial complexes with Abberant conduction Otherwise normal ECG When comapred to prior, new PVC and apperance of bigeminy. no STEMI Confirmed by Antony Blackbird [phone]) on 12/01/2019 3:31:15 PM   Radiology CT ABDOMEN PELVIS W CONTRAST  Result Date: 12/01/2019 CLINICAL DATA:  Left-sided abdominal pain. EXAM: CT ABDOMEN AND PELVIS WITH CONTRAST TECHNIQUE: Multidetector CT imaging of the abdomen and pelvis was performed using the standard protocol following bolus administration of intravenous contrast. CONTRAST:  167mL OMNIPAQUE IOHEXOL 300 MG/ML  SOLN COMPARISON:  December 17, 2014 FINDINGS: Lower chest: No acute abnormality. Hepatobiliary: No focal liver abnormality is seen. Status post cholecystectomy. No biliary  dilatation. Pancreas: Unremarkable. No pancreatic ductal dilatation or surrounding inflammatory changes. Spleen: Normal in size without focal abnormality. Adrenals/Urinary Tract: Adrenal glands are unremarkable. Kidneys are normal, without renal calculi or hydronephrosis. A stable 4.3 cm diameter simple cyst is seen within the posterolateral aspect of the mid right kidney. Bladder is unremarkable. Stomach/Bowel: Surgical sutures are seen within the gastric region. Appendix appears normal. Surgically anastomosed bowel is seen within the left lower quadrant (axial CT images 55 through 65, CT series number 2). A short segment of dilated small bowel is seen proximal to this area of surgical anastomosis (5.6 cm in diameter). Stable, asymmetric thickening of the small bowel wall is also seen at the level of the anastomosis (approximately 1.5 cm). Noninflamed diverticula are seen throughout the large bowel. Vascular/Lymphatic: There is mild calcification of the abdominal aorta and bilateral common iliac arteries, without evidence of aneurysmal dilatation. No enlarged abdominal or pelvic lymph nodes. Reproductive: Status post hysterectomy. No adnexal masses. Other: No abdominal wall hernia or abnormality. No abdominopelvic ascites. Musculoskeletal: Bilateral metallic density pedicle screws are seen at the levels of L4 and L5. IMPRESSION: 1. Evidence of prior gastric bypass surgery and prior cholecystectomy. 2. Colonic diverticulosis. 3. Predominant stable, short segment of dilated small bowel, with small bowel wall thickening, proximal to an area of surgical anastomosis within the left lower quadrant. This may be chronic and postoperative in nature. 4. Postoperative changes within the lower lumbar spine. Aortic Atherosclerosis (ICD10-I70.0). Electronically Signed   By: Virgina Norfolk M.D.   On: 12/01/2019 18:01    Procedures Procedures (including critical care time)  Medications Ordered in ED Medications    morphine 4 MG/ML injection 4 mg (4 mg Intravenous Given 12/01/19 1721)  sodium chloride 0.9 % bolus 500 mL (0 mLs Intravenous Stopped 12/01/19 1906)  iohexol (OMNIPAQUE) 300 MG/ML solution 100 mL (100 mLs Intravenous Contrast Given 12/01/19 1731)    ED Course  I have reviewed the triage vital signs and the nursing notes.  Pertinent labs & imaging results that were available during my care of the patient were reviewed by me and considered in my medical decision making (see chart for details).    MDM Rules/Calculators/A&P  LANDRA HOWZE is a 74 y.o. female with a past medical history significant for hyper tension, GERD, prior cholecystectomy, prior gastric bypass, prior hysterectomy, prior diverticulosis, and prior kidney stones who presents with left-sided abdominal and left flank pain on and off for the last few months.  She reports that today, her symptoms worsen and is now an 8 out of 10 severity in her left leg going towards her left lower quadrant of the abdomen.  She reports some nausea earlier but no vomiting.  She reports that is improved.  She reports she has had some dark and foul-smelling urine for the last few days as well as occasional blood mixed in with her stool.  She denies any rectal pain and she reports history of hemorrhoids that have been doing much better.  She denies any abdominal trauma.  She denies any syncope but does report some lightheadedness.  She denies chest pain, shortness of breath, palpitations.  She reports that the symptoms worsened today prompting her to seek evaluation.  On exam, lungs clear and chest nontender.  Left flank is tender to palpation with no rash to suggest shingles.  Mild CVA tenderness on the left.  No right-sided or midline tenderness.  No lower extremity tenderness or swelling.  Good pulses in extremities.  Patient resting comfortably.  Clinically I suspect either pyelonephritis on the left side versus kidney stone  versus diverticulitis.  Patient will be given pain medicine and will have labs and imaging to determine etiology of symptoms worsening.  Anticipate reassessment after work-up.  CT scan showed no evidence of acute obstruction but did show dilated small bowel just proximal to a surgical anastomosis.  I asked the patient and she thinks it may related to her prior gastric bypass surgery.  I called general surgery to talk it over with them and they suspect this is not an acute surgical problem.  They feel she needs to follow-up with gastroenterology due to this bowel dilation likely contributing to her discomfort.  Patient was feeling better after medications.  Patient will give improving her pain medicine nausea medicine will follow gastroenterology.  We not find any other acute problems today.  Patient agreed with plan of care and was discharged in good condition.   Final Clinical Impression(s) / ED Diagnoses Final diagnoses:  Left sided abdominal pain    Rx / DC Orders ED Discharge Orders         Ordered    oxyCODONE-acetaminophen (PERCOCET/ROXICET) 5-325 MG tablet  Every 4 hours PRN        12/01/19 2115    ondansetron (ZOFRAN) 4 MG tablet  Every 8 hours PRN        12/01/19 2115         Clinical Impression: 1. Left sided abdominal pain     Disposition: Discharge  Condition: Good  I have discussed the results, Dx and Tx plan with the pt(& family if present). He/she/they expressed understanding and agree(s) with the plan. Discharge instructions discussed at great length. Strict return precautions discussed and pt &/or family have verbalized understanding of the instructions. No further questions at time of discharge.    New Prescriptions   ONDANSETRON (ZOFRAN) 4 MG TABLET    Take 1 tablet (4 mg total) by mouth every 8 (eight) hours as needed for nausea or vomiting.   OXYCODONE-ACETAMINOPHEN (PERCOCET/ROXICET) 5-325 MG TABLET    Take 2 tablets by mouth every 4 (four) hours as  needed for severe pain.    Follow  Up: [facility] [address] [place] [address] [phone]    Gastroenterology, [address] [place] [address] [place], [place] [address] [phone]     [facility] [place] DEPARTMENT [id number] [place] [phone]       Shauntee Karp, Gwenyth Allegra, MD 12/01/19 2118

## 2019-12-01 NOTE — Discharge Instructions (Addendum)
Your work-up today did not show any evidence of acute complete obstruction, diverticulitis, or kidney stones.  It showed dilation of your bowel proximal to the surgical anastomosis.  I spoke with the surgery team and they do not feel this is an acute surgical problem and they recommend you follow-up with gastroenterology.  I included the number to 2 different gastroenterology groups for you to try to schedule an appointment with soon as possible.  Please rest and stay hydrated.  Please use the pain medicine help with your discomfort.  If any symptoms change or worsen, please return to the nearest emergency department.

## 2019-12-03 LAB — URINE CULTURE: Culture: <10000 — AB

## 2019-12-12 ENCOUNTER — Emergency Department (HOSPITAL_COMMUNITY): Payer: Medicare Other

## 2019-12-12 ENCOUNTER — Other Ambulatory Visit: Payer: Self-pay

## 2019-12-12 ENCOUNTER — Emergency Department (HOSPITAL_BASED_OUTPATIENT_CLINIC_OR_DEPARTMENT_OTHER): Payer: Medicare Other

## 2019-12-12 ENCOUNTER — Encounter (HOSPITAL_BASED_OUTPATIENT_CLINIC_OR_DEPARTMENT_OTHER): Payer: Self-pay | Admitting: *Deleted

## 2019-12-12 ENCOUNTER — Emergency Department (HOSPITAL_BASED_OUTPATIENT_CLINIC_OR_DEPARTMENT_OTHER)
Admission: EM | Admit: 2019-12-12 | Discharge: 2019-12-12 | Disposition: A | Discharge status: Home / Self Care | Payer: Medicare Other | Attending: Emergency Medicine | Admitting: Emergency Medicine

## 2019-12-12 DIAGNOSIS — R200 Anesthesia of skin: Secondary | ICD-10-CM

## 2019-12-12 DIAGNOSIS — R0789 Other chest pain: Secondary | ICD-10-CM | POA: Diagnosis not present

## 2019-12-12 DIAGNOSIS — I10 Essential (primary) hypertension: Secondary | ICD-10-CM | POA: Insufficient documentation

## 2019-12-12 DIAGNOSIS — R202 Paresthesia of skin: Secondary | ICD-10-CM | POA: Diagnosis present

## 2019-12-12 DIAGNOSIS — E041 Nontoxic single thyroid nodule: Secondary | ICD-10-CM | POA: Diagnosis not present

## 2019-12-12 DIAGNOSIS — Z20822 Contact with and (suspected) exposure to covid-19: Secondary | ICD-10-CM | POA: Insufficient documentation

## 2019-12-12 LAB — CBC WITH DIFFERENTIAL/PLATELET
Abs Immature Granulocytes: 0.01 10*3/uL (ref 0.00–0.07)
Basophils Absolute: 0 10*3/uL (ref 0.0–0.1)
Basophils Relative: 0 %
Eosinophils Absolute: 0.1 10*3/uL (ref 0.0–0.5)
Eosinophils Relative: 2 %
HCT: 37.9 % (ref 36.0–46.0)
Hemoglobin: 11.7 g/dL — ABNORMAL LOW (ref 12.0–15.0)
Immature Granulocytes: 0 %
Lymphocytes Relative: 48 %
Lymphs Abs: 2.2 10*3/uL (ref 0.7–4.0)
MCH: 25.5 pg — ABNORMAL LOW (ref 26.0–34.0)
MCHC: 30.9 g/dL (ref 30.0–36.0)
MCV: 82.6 fL (ref 80.0–100.0)
Monocytes Absolute: 0.4 10*3/uL (ref 0.1–1.0)
Monocytes Relative: 8 %
Neutro Abs: 2 10*3/uL (ref 1.7–7.7)
Neutrophils Relative %: 42 %
Platelets: 253 10*3/uL (ref 150–400)
RBC: 4.59 MIL/uL (ref 3.87–5.11)
RDW: 14.9 % (ref 11.5–15.5)
WBC: 4.6 10*3/uL (ref 4.0–10.5)
nRBC: 0 % (ref 0.0–0.2)

## 2019-12-12 LAB — BASIC METABOLIC PANEL
Anion gap: 9 (ref 5–15)
BUN: 12 mg/dL (ref 8–23)
CO2: 27 mmol/L (ref 22–32)
Calcium: 8.5 mg/dL — ABNORMAL LOW (ref 8.9–10.3)
Chloride: 102 mmol/L (ref 98–111)
Creatinine, Ser: 0.66 mg/dL (ref 0.44–1.00)
GFR, Estimated: >60 mL/min (ref >60)
Glucose, Bld: 89 mg/dL (ref 70–99)
Potassium: 3.9 mmol/L (ref 3.5–5.1)
Sodium: 138 mmol/L (ref 135–145)

## 2019-12-12 LAB — RESPIRATORY PANEL BY RT PCR (FLU A&B, COVID)
Influenza A by PCR: NEGATIVE
Influenza B by PCR: NEGATIVE
SARS Coronavirus 2 by RT PCR: NEGATIVE

## 2019-12-12 LAB — TROPONIN I (HIGH SENSITIVITY): Troponin I (High Sensitivity): 7 ng/L (ref <18)

## 2019-12-12 MED ORDER — IOHEXOL 350 MG/ML SOLN
100.0000 mL | Freq: Once | INTRAVENOUS | Status: AC | PRN
  Administered 2019-12-12: 100 mL via INTRAVENOUS

## 2019-12-12 MED ORDER — ASPIRIN EC 81 MG PO TBEC
162.0000 mg | DELAYED_RELEASE_TABLET | Freq: Once | ORAL | Status: AC
  Administered 2019-12-12: 162 mg via ORAL
  Filled 2019-12-12: qty 2

## 2019-12-12 NOTE — ED Provider Notes (Signed)
Patient transferred from [facility] ED for MRI, with plan for d/c to home if neg for acute cva.  MRI neg for acute cva.   Recheck pt - pt denies any numbness/weakness currently. States earlier symptoms in finger tips of right hand. No loss of normal functional ability. No change in speech or vision.  Pt denies any chest pain or discomfort. No sob. Indicates she feels ready to go home.   Pt also notes pcp appt already arranged for 6 days from now.   Rec ecasa q day, and close pcp f/u that is already arranged. Pt currently appears stable for d/c.   Return precautions provided.      Lajean Saver, MD 12/12/19 [phone]

## 2019-12-12 NOTE — ED Provider Notes (Signed)
Emergency Department Provider Note   I have reviewed the triage vital signs and the nursing notes.   HISTORY  Chief Complaint MRI/MCHP   HPI Marilyn Estrada is a 74 y.o. female past medical history reviewed below presents to the emergency department with numbness in the right hand since last night.  Patient states that last night she was going about her normal bedtime routine.  She took a shower and went to sit in the recliner to watch television.  She states that she suddenly developed numbness in the right face and right arm.  She felt some slight blurry vision in the right and feeling in her throat as if she needed to belch.  She did have some associated chest heaviness.  All symptoms seem to resolve after approximately 10 minutes but then returned shortly afterwards again lasting for approximately 10 minutes.  Her husband was there and witnessed the event.  He apparently did not notice any asymmetry in her face or change in her voice.  She took her blood pressure and it was elevated but ultimately symptoms again resolved and she went to bed.  She woke up this morning with continued tingling in the fingers of the right hand only.  She is not experiencing neck pain.  She never had symptoms in her right leg.  No prior history of stroke.  No current chest discomfort or vision change.   Past Medical History:  Diagnosis Date  . Arthritis   . GERD (gastroesophageal reflux disease)   . Hypertension   . Hypoglycemia     There are no problems to display for this patient.   Past Surgical History:  Procedure Laterality Date  . ABDOMINAL HYSTERECTOMY    . BACK SURGERY    . bladder tack    . CHOLECYSTECTOMY    . GASTRIC BYPASS    . NECK SURGERY    . TONSILLECTOMY      Allergies Bee venom, Sulfa antibiotics, Tramadol, and Shellfish-derived products  No family history on file.  Social History Social History   Tobacco Use  . Smoking status: Never Smoker  . Smokeless tobacco: Never  Used  Vaping Use  . Vaping Use: Never used  Substance Use Topics  . Alcohol use: No  . Drug use: No    Review of Systems  Constitutional: No fever/chills Eyes: No visual changes. ENT: No sore throat. Cardiovascular: Positive chest pain last night (resolved). Respiratory: Denies shortness of breath. Gastrointestinal: Denies abdominal pain.  No nausea, no vomiting.  No diarrhea.  No constipation. Genitourinary: Negative for dysuria. Musculoskeletal: Negative for back pain. Skin: Negative for rash. Neurological: Negative for headaches. Positive right arm weakness/numbness x 2 episodes last night. Positive right face numbness. No leg symptoms.   10-point ROS otherwise negative.  ____________________________________________   PHYSICAL EXAM:  VITAL SIGNS: ED Triage Vitals  Enc Vitals Group     BP 12/12/19 1100 (!) 153/106     Pulse Rate 12/12/19 1100 66     Resp 12/12/19 1100 18     Temp 12/12/19 1100 98.3 F (36.8 C)     Temp Source 12/12/19 1100 Oral     SpO2 12/12/19 1100 98 %     Weight 12/12/19 1058 209 lb (94.8 kg)     Height 12/12/19 1058 5\' 2"  (1.575 m)    Constitutional: Alert and oriented. Well appearing and in no acute distress. Eyes: Conjunctivae are normal. PERRL. EOMI.  Head: Atraumatic. Nose: No congestion/rhinnorhea. Mouth/Throat: Mucous membranes are moist.  Neck: No stridor. No c spine tenderness.  Cardiovascular: Normal rate, regular rhythm. Good peripheral circulation. Grossly normal heart sounds.   Respiratory: Normal respiratory effort.  No retractions. Lungs CTAB. Gastrointestinal: Soft and nontender. No distention.  Musculoskeletal: No gross deformities of extremities. Neurologic:  Normal speech and language.  No facial asymmetry.  Equal sensation in the bilateral face, arms, legs.  She does have a decreased sensation to the fingers of the right hand.  No pronator drift.  Skin:  Skin is warm, dry and intact. No rash  noted.  ____________________________________________   LABS (all labs ordered are listed, but only abnormal results are displayed)  Labs Reviewed  BASIC METABOLIC PANEL - Abnormal; Notable for the following components:      Result Value   Calcium 8.5 (*)    All other components within normal limits  CBC WITH DIFFERENTIAL/PLATELET - Abnormal; Notable for the following components:   Hemoglobin 11.7 (*)    MCH 25.5 (*)    All other components within normal limits  RESPIRATORY PANEL BY RT PCR (FLU A&B, COVID)  TROPONIN I (HIGH SENSITIVITY)   ____________________________________________  EKG   EKG Interpretation  Date/Time:  Tuesday December 12 2019 11:01:32 EDT Ventricular Rate:  66 PR Interval:  192 QRS Duration: 70 QT Interval:  402 QTC Calculation: 421 R Axis:   51 Text Interpretation: Normal sinus rhythm Septal infarct , age undetermined Abnormal ECG No STEMI Confirmed by Nanda Quinton [phone]) on 12/12/2019 11:29:31 AM       ____________________________________________  RADIOLOGY  CTA head and neck along with CXR reviewed.  ____________________________________________   PROCEDURES  Procedure(s) performed:   Procedures  None  ____________________________________________   INITIAL IMPRESSION / ASSESSMENT AND PLAN / ED COURSE  Pertinent labs & imaging results that were available during my care of the patient were reviewed by me and considered in my medical decision making (see chart for details).   Patient presents to the emergency department with numbness of the right hand since 8 PM yesterday.  She had more significant symptoms last night which have resolved.  She has several risk factors for stroke.  Her chest discomfort last night was very atypical and EKG here is reassuring.  Will obtain troponins and chest x-ray but ultimately patient will need some neuro imaging here.  I have ordered CTA of the head and neck. Will consult Neurology after imaging. No CODE  STROKE.   01:09 AM  Spoke with Dr. Malen Gauze regarding the case and CTA findings.  Agrees with my plan for ED to ED transfer for MRI of the brain and cervical spine.  Spoke with Dr. Sherry Ruffing who accepts the patient in ED to ED transfer for MRI.  ____________________________________________  FINAL CLINICAL IMPRESSION(S) / ED DIAGNOSES  Final diagnoses:  Right arm numbness  Thyroid nodule     MEDICATIONS GIVEN DURING THIS VISIT:  Medications  iohexol (OMNIPAQUE) 350 MG/ML injection 100 mL (100 mLs Intravenous Contrast Given 12/12/19 1210)  aspirin EC tablet 162 mg (162 mg Oral Given 12/12/19 2030)    Note:  This document was prepared using Dragon voice recognition software and may include unintentional dictation errors.  Nanda Quinton, MD, [facility] Emergency Medicine    Connar Keating, Wonda Olds, MD 12/15/19 [phone]

## 2019-12-12 NOTE — ED Notes (Signed)
Transfer to [facility] ED for MRI, Dr Tegeler accepting

## 2019-12-12 NOTE — ED Triage Notes (Signed)
Numbness to her right hand since 8pm yesterday. Fulness in the center of her chest that makes her feel like she needs to belch per pt. Pain in her left shoulder for over 2 months. Her MD is aware and is unable to find a cause.

## 2019-12-12 NOTE — Discharge Instructions (Addendum)
It was our pleasure to provide your ER care today - we hope that you feel better.  Take an enteric coated aspirin a day.   Your MRI was read as showing no acute stroke. Mild degenerative changes were noted in your neck. Also noted is a 2.2 cm right thyroid nodule - discuss with your doctor at follow up with her next week, and discuss possible ultrasound and/or further workup.   Also discuss follow up with your doctor regarding your earlier symptoms, and your blood pressure which is high today.   Return to ER if worse, new symptoms, change in speech or vision, one-side of body numbness/weakness, chest pain, trouble breathing, or other concern.

## 2019-12-12 NOTE — ED Notes (Signed)
Paged neurology through [facility], spoke with [facility].

## 2019-12-12 NOTE — ED Notes (Signed)
Patient transported to MRI 

## 2019-12-12 NOTE — ED Notes (Signed)
Pt for ED to ED transfer to [facility] for MRI, IV site right ac secured for transfer.  Per Dr Laverta Baltimore ok for pt to be transported via private vehicle

## 2020-04-17 ENCOUNTER — Ambulatory Visit (INDEPENDENT_AMBULATORY_CARE_PROVIDER_SITE_OTHER): Payer: Medicare Other | Admitting: Podiatry

## 2020-04-17 ENCOUNTER — Ambulatory Visit (INDEPENDENT_AMBULATORY_CARE_PROVIDER_SITE_OTHER): Payer: Medicare Other

## 2020-04-17 ENCOUNTER — Other Ambulatory Visit: Payer: Self-pay

## 2020-04-17 DIAGNOSIS — L84 Corns and callosities: Secondary | ICD-10-CM | POA: Diagnosis not present

## 2020-04-17 DIAGNOSIS — D169 Benign neoplasm of bone and articular cartilage, unspecified: Secondary | ICD-10-CM | POA: Diagnosis not present

## 2020-04-17 DIAGNOSIS — M779 Enthesopathy, unspecified: Secondary | ICD-10-CM | POA: Diagnosis not present

## 2020-04-17 DIAGNOSIS — M21619 Bunion of unspecified foot: Secondary | ICD-10-CM | POA: Diagnosis not present

## 2020-04-17 MED ORDER — TRIAMCINOLONE ACETONIDE 10 MG/ML IJ SUSP
10.0000 mg | Freq: Once | INTRAMUSCULAR | Status: AC
  Administered 2020-04-17: 10 mg

## 2020-04-17 NOTE — Progress Notes (Signed)
Subjective:   Patient ID: Marilyn Estrada, female   DOB: 75 y.o.   MRN: [id number]   HPI Patient presents with quite a bit of pain on the dorsum of the right foot with inflammation and lesion formation bilateral along with structural changes bilateral.  Patient states at times shoe gear is painful on the dorsum right foot.  Patient does not smoke and likes to be active if possible   Review of Systems  All other systems reviewed and are negative.       Objective:  Physical Exam Vitals and nursing note reviewed.  Constitutional:      Appearance: She is well-developed and well-nourished.  Cardiovascular:     Pulses: Intact distal pulses.  Pulmonary:     Effort: Pulmonary effort is normal.  Musculoskeletal:        General: Normal range of motion.  Skin:    General: Skin is warm.  Neurological:     Mental Status: She is alert.     Neurovascular status intact muscle strength adequate range of motion adequate.  Patient is found to have inflammation dorsal right foot in the area of the midtarsal joint with spur formation and inflammation of the extensor tendon along with lesion formation bilateral with moderate structural bunion deformity bilateral.  Good digital perfusion well oriented     Assessment:  Multiple problems with tendinitis with bone spur formation right along with keratotic lesion structural changes bilateral     Plan:  H&P reviewed all conditions sterile prep injected the tendon complex right 3 mg Dexasone Kenalog 5 mg Xylocaine advised on anti-inflammatories topical medicines debrided lesions discussed structures do not recommend surgical intervention unless symptoms were to get worse  X-rays indicate there is spur formation there is moderate bunion deformity no other pathology noted

## 2021-07-02 NOTE — Telephone Encounter (Addendum)
 Patient called and would like to schedule with Dr. Marc had surgery in 2020 and had fell recently.  Please call to schedule.

## 2021-12-01 ENCOUNTER — Emergency Department (HOSPITAL_BASED_OUTPATIENT_CLINIC_OR_DEPARTMENT_OTHER)
Admission: EM | Admit: 2021-12-01 | Discharge: 2021-12-01 | Disposition: A | Discharge status: Home / Self Care | Payer: Medicare Other | Attending: Emergency Medicine | Admitting: Emergency Medicine

## 2021-12-01 ENCOUNTER — Encounter (HOSPITAL_BASED_OUTPATIENT_CLINIC_OR_DEPARTMENT_OTHER): Payer: Self-pay

## 2021-12-01 DIAGNOSIS — R21 Rash and other nonspecific skin eruption: Secondary | ICD-10-CM | POA: Insufficient documentation

## 2021-12-01 MED ORDER — TRIAMCINOLONE ACETONIDE 0.1 % EX CREA: 1.0000 | TOPICAL_CREAM | Freq: Two times a day (BID) | CUTANEOUS | 0 refills | Status: AC

## 2021-12-01 NOTE — ED Triage Notes (Signed)
States she has had itching and burning to her skin x 3-4 days. C/o rash that itches. Recently started pravastatin and started itching after.

## 2021-12-01 NOTE — ED Notes (Signed)
Discharge instructions reviewed with patient. Patient verbalizes understanding, no further questions at this time. Medications/prescriptions and follow up information provided. No acute distress noted at time of departure.  

## 2021-12-01 NOTE — Discharge Instructions (Signed)
I would try to lotion the areas first and if that does not work then I will have you try Benadryl cream.  If that does not work then I have prescribed you a steroid cream that you can try.  Please call your family doctor and let them know that this is going on with you.  This could be a reaction to your cholesterol medication.  They may want to change your medication that you are on or give it some time to see if it goes away.  Please return for rapid spreading of the rash if you develop a fever if you are throwing up or having diarrhea or having difficulty breathing.

## 2021-12-01 NOTE — ED Provider Notes (Signed)
[place] EMERGENCY DEPARTMENT Provider Note   CSN: [id number] Arrival date & time: 12/01/21  4098     History  Chief Complaint  Patient presents with   Pruritis    Marilyn Estrada is a 76 y.o. female.  76 yo F with a chief complaints of a rash.  This is itchy and sometimes feels like pins-and-needles.  Mostly to her upper chest and upper arms.  She denies any new soaps detergents perfumes.  She feels like maybe she is dehydrated.  No intraoral rash no nausea no vomiting no difficulty breathing.  Started a new cholesterol medication.        Home Medications Prior to Admission medications   Medication Sig Start Date End Date Taking? Authorizing Provider  triamcinolone cream (KENALOG) 0.1 % Apply 1 Application topically 2 (two) times daily. 12/01/21  Yes Deno Etienne, DO  albuterol (PROVENTIL HFA;VENTOLIN HFA) 108 (90 Base) MCG/ACT inhaler Inhale into the lungs.    [provider]  amLODipine (NORVASC) 10 MG tablet Take 10 mg by mouth daily.    [provider]  Cholecalciferol (VITAMIN D PO) Take by mouth.    [provider]  EPINEPHrine 0.3 mg/0.3 mL IJ SOAJ injection Inject 0.3 mg into the muscle.    [provider]  Esomeprazole Magnesium (NEXIUM PO) Take by mouth.    [provider]  famotidine (PEPCID) 20 MG tablet Take 20 mg by mouth 2 (two) times daily.    [provider]  Fe Fum-FePoly-Vit C-Vit B3 (INTEGRA PO) Take by mouth.    [provider]  HYDROcodone-acetaminophen (NORCO) 5-325 MG tablet Take 1 tablet by mouth every 4 (four) hours as needed (for pain; may cause constipation). Patient not taking: Reported on 05/12/2016 05/22/15   Molpus, Jenny Reichmann, MD  meclizine (ANTIVERT) 25 MG tablet Take 1 tablet (25 mg total) by mouth 3 (three) times daily as needed for dizziness. Patient not taking: Reported on 05/12/2016 05/06/13   Veryl Speak, MD  methocarbamol (ROBAXIN) 750 MG tablet Take 1 tablet (750 mg total)  by mouth every 8 (eight) hours as needed for muscle spasms. 11/14/17   Lajean Saver, MD  omeprazole (PRILOSEC) 20 MG capsule Take 20 mg by mouth daily.    [provider]  ondansetron (ZOFRAN) 4 MG tablet Take 1 tablet (4 mg total) by mouth every 8 (eight) hours as needed for nausea or vomiting. 12/01/19   Tegeler, Gwenyth Allegra, MD  oxyCODONE-acetaminophen (PERCOCET/ROXICET) 5-325 MG tablet Take 2 tablets by mouth every 4 (four) hours as needed for severe pain. 12/01/19   Tegeler, Gwenyth Allegra, MD  ranitidine (ZANTAC) 75 MG tablet Take 1 tablet (75 mg total) by mouth 2 (two) times daily. 07/24/17   Glenis Smoker, MD  sucralfate (CARAFATE) 1 g tablet Take 1 tablet (1 g total) by mouth 4 (four) times daily -  with meals and at bedtime. Mix with 1c water before using. 07/24/17   Glenis Smoker, MD  triamterene-hydrochlorothiazide [facility]) 37.5-25 MG tablet  04/02/16   [provider]  UNKNOWN TO PATIENT     [provider]  Wheat Dextrin (BENEFIBER) POWD Take 3 g by mouth.    [provider]      Allergies    Bee venom, Oxycodone, Sulfa antibiotics, Tramadol, and Shellfish-derived products    Review of Systems   Review of Systems  Physical Exam Updated Vital Signs BP (!) 162/80 (BP Location: Right Arm)   Pulse 67   Temp 97.8  F (36.6 C) (Oral)   Resp 18   Ht '5\' 2"'$  (1.575 m)   Wt 93.9 kg   SpO2 97%   BMI 37.86 kg/m  Physical Exam Vitals and nursing note reviewed.  Constitutional:      General: She is not in acute distress.    Appearance: She is well-developed. She is not diaphoretic.  HENT:     Head: Normocephalic and atraumatic.  Eyes:     Pupils: Pupils are equal, round, and reactive to light.  Cardiovascular:     Rate and Rhythm: Normal rate and regular rhythm.     Heart sounds: No murmur heard.    No friction rub. No gallop.  Pulmonary:     Effort: Pulmonary effort is normal.     Breath sounds: No wheezing or rales.   Abdominal:     General: There is no distension.     Palpations: Abdomen is soft.     Tenderness: There is no abdominal tenderness.  Musculoskeletal:        General: No tenderness.     Cervical back: Normal range of motion and neck supple.  Skin:    General: Skin is warm and dry.     Comments: Fine erythematous rash mostly to the upper extremities and along the upper chest.  Dry skin.  No tenting.  Neurological:     Mental Status: She is alert and oriented to person, place, and time.  Psychiatric:        Behavior: Behavior normal.     ED Results / Procedures / Treatments   Labs (all labs ordered are listed, but only abnormal results are displayed) Labs Reviewed - No data to display  EKG None  Radiology No results found.  Procedures Procedures    Medications Ordered in ED Medications - No data to display  ED Course/ Medical Decision Making/ A&P                           Medical Decision Making Risk Prescription drug management.   76 yo F with a chief complaints of itchy skin.  This has been going on for couple days.  Clinically appears to have dry skin.  We will have her attempt to moisturize.  If that does not work we will have her try antihistamine creams.  That does not work we will have her try a steroid cream.  Could be related to her due to statin.  Discussed follow-up with her PCP in the office.  9:09 AM:  I have discussed the diagnosis/risks/treatment options with the patient.  Evaluation and diagnostic testing in the emergency department does not suggest an emergent condition requiring admission or immediate intervention beyond what has been performed at this time.  They will follow up with PCP. We also discussed returning to the ED immediately if new or worsening sx occur. We discussed the sx which are most concerning (e.g., sudden worsening pain, fever, inability to tolerate by mouth, n/v/d, sob, rapid spreading) that necessitate immediate return. Medications  administered to the patient during their visit and any new prescriptions provided to the patient are listed below.  Medications given during this visit Medications - No data to display   The patient appears reasonably screen and/or stabilized for discharge and I doubt any other medical condition or other [facility] requiring further screening, evaluation, or treatment in the ED at this time prior to discharge.          Final  Clinical Impression(s) / ED Diagnoses Final diagnoses:  Rash and nonspecific skin eruption    Rx / DC Orders ED Discharge Orders          Ordered    triamcinolone cream (KENALOG) 0.1 %  2 times daily        12/01/21 0905              Deno Etienne, DO 12/01/21 [phone]

## 2022-07-15 IMAGING — MR MR HEAD W/O CM
9 of 10 series · 36 of 48 positions shown · non-contrast
Comparison: CT angiogram head/neck performed earlier the same day
12/12/2019. Head CT 07/13/2019. Brain MRI 09/06/2016.

CLINICAL DATA: Transient ischemic attack. Additional history
provided: Patient reports numbness to right hand since 8 p.m.
yesterday.

EXAM:
MRI HEAD WITHOUT CONTRAST
TECHNIQUE: Multiplanar, multiecho pulse sequences of the brain and surrounding
structures were obtained without intravenous contrast.

[Series 3: T1 · sagittal · 5.0mm · 0.47mm/px · 1 of 23 slices shown (1 of 2)]
[im 1/23]
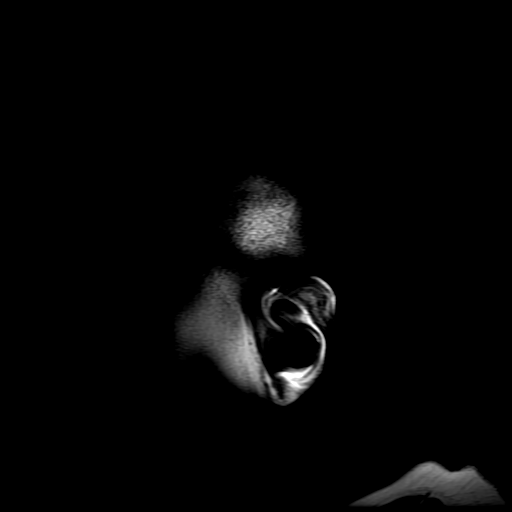

[Series 3: DWI · axial · 3.0mm · 1.09mm/px · z∈[-58,+72]mm · 9 of 90 slices shown (1 of 4)]
[im 1/90]
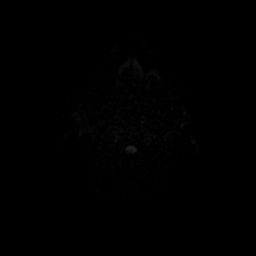
[im 12/90]
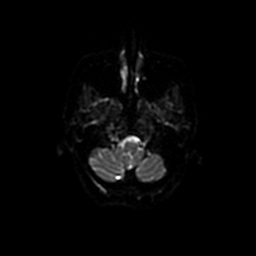
[im 23/90]
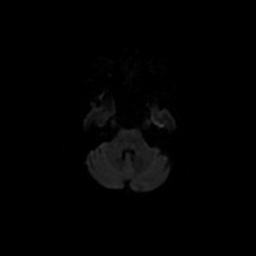
[im 34/90]
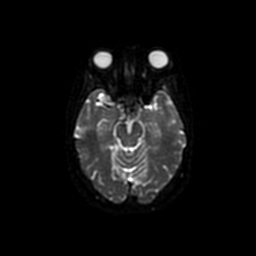
[im 45/90]
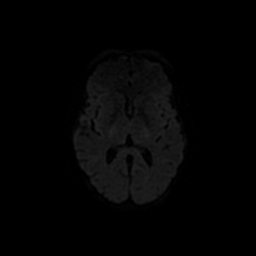
[im 56/90]
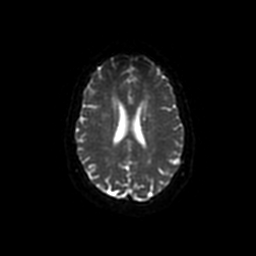
[im 67/90]
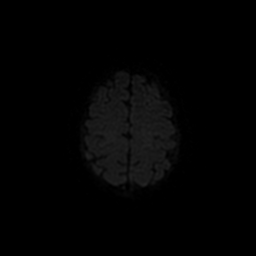
[im 78/90]
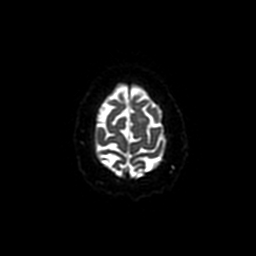
[im 90/90]
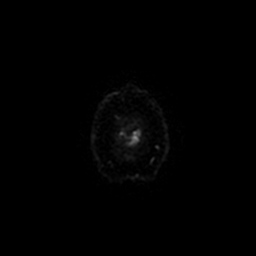

[Series 4: T2 · axial · 5.0mm · 0.43mm/px · z∈[-83,+55]mm · 3 of 24 slices shown (1 of 2)]
[im 1/24]
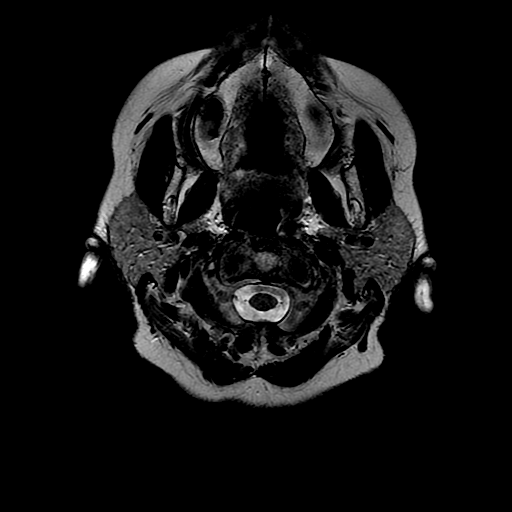
[im 12/24]
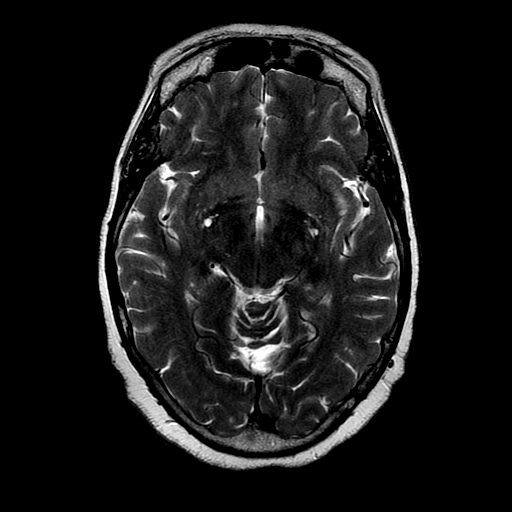
[im 24/24]
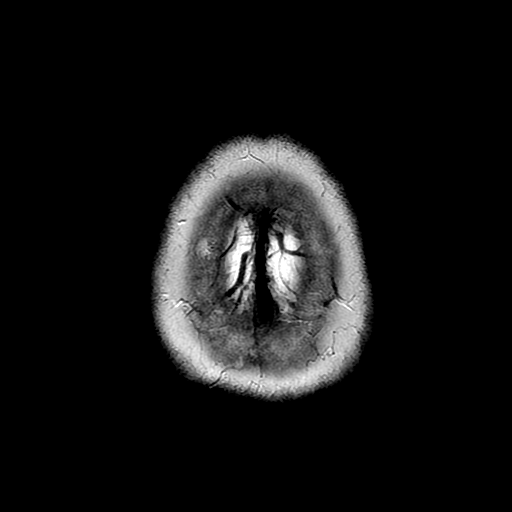

[Series 4: DWI · coronal · 5.0mm · 1.09mm/px · 7 of 68 slices shown (2 of 4)]
[im 1/68]
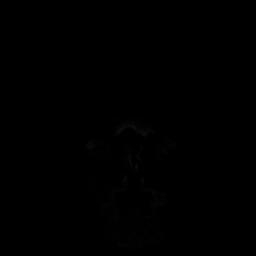
[im 12/68]
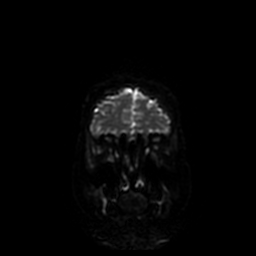
[im 23/68]
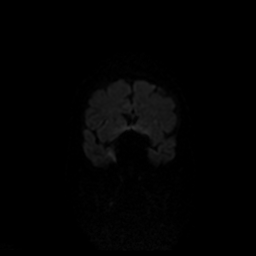
[im 34/68]
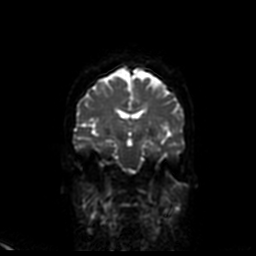
[im 45/68]
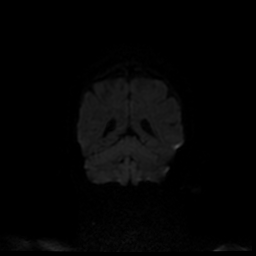
[im 56/68]
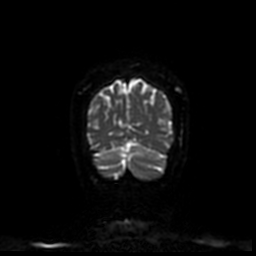
[im 68/68]
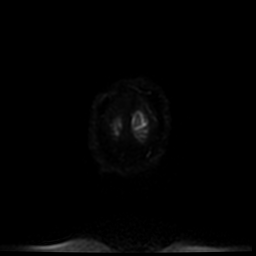

[Series 5: FLAIR · axial · 3.0mm · 0.43mm/px · z∈[-83,+55]mm · 3 of 24 slices shown]
[im 1/24]
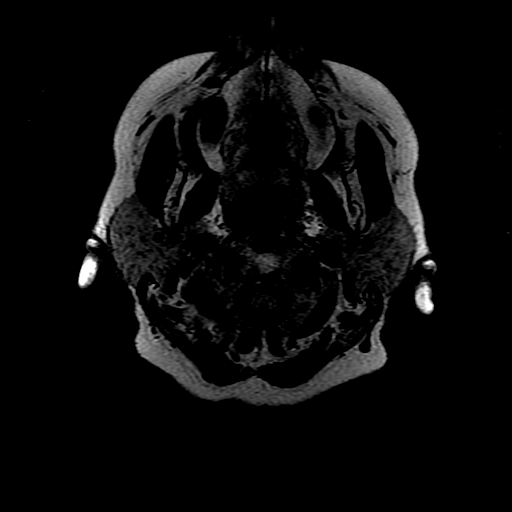
[im 12/24]
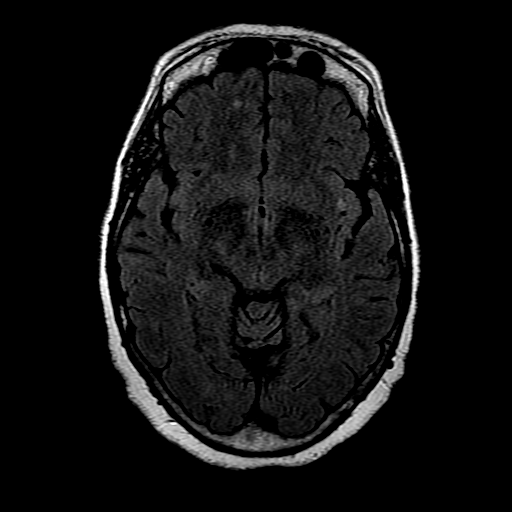
[im 24/24]
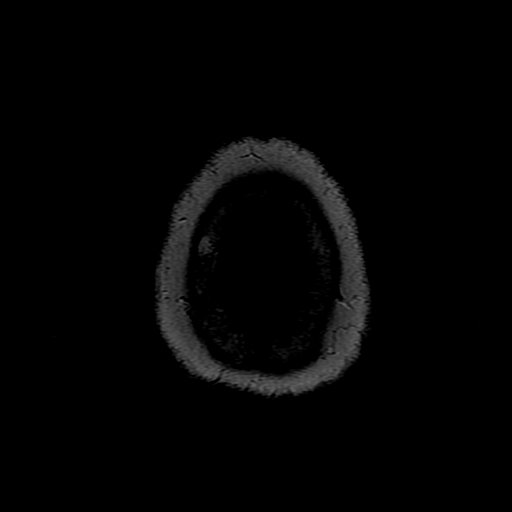

[Series 8: T1 · axial · 3.0mm · 0.47mm/px · 1 of 100 slices shown (2 of 2)]
[im 1/100]
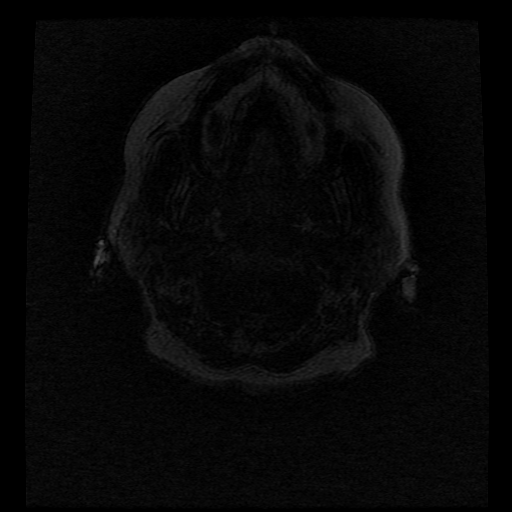

[Series 9: T2 · coronal · 5.0mm · 0.39mm/px · 3 of 24 slices shown (2 of 2)]
[im 1/24]
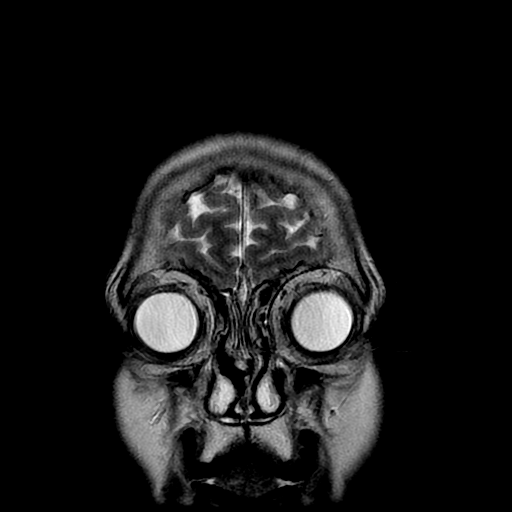
[im 12/24]
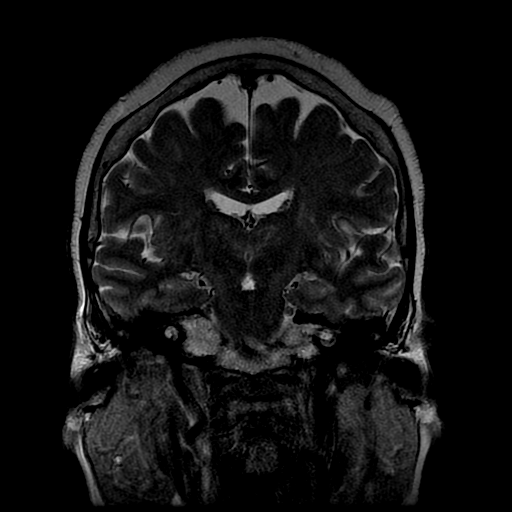
[im 24/24]
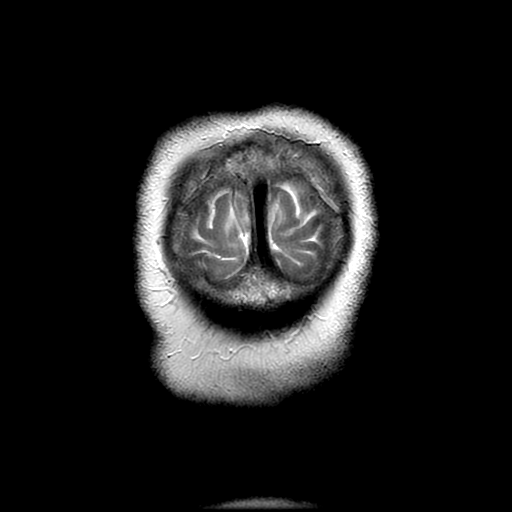

[Series 300: DWI · axial · 3.0mm · 1.09mm/px · z∈[-58,+72]mm · 5 of 45 slices shown (3 of 4)]
[im 1/45]
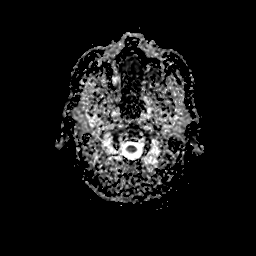
[im 12/45]
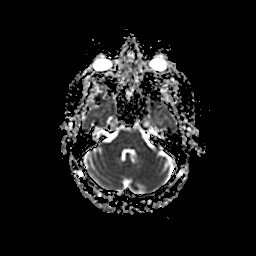
[im 23/45]
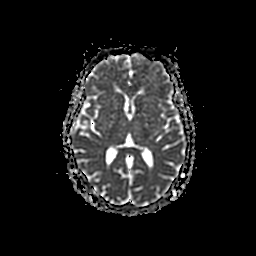
[im 34/45]
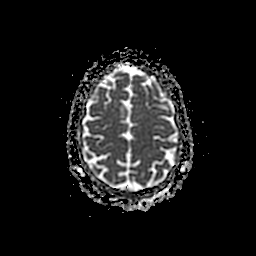
[im 45/45]
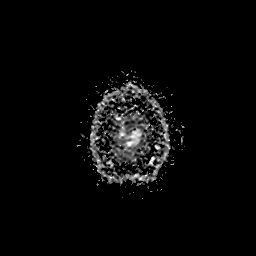

[Series 400: DWI · coronal · 5.0mm · 1.09mm/px · 4 of 34 slices shown (4 of 4)]
[im 1/34]
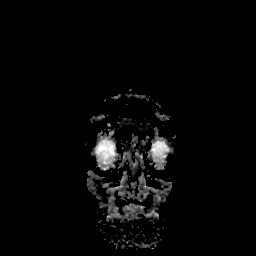
[im 12/34]
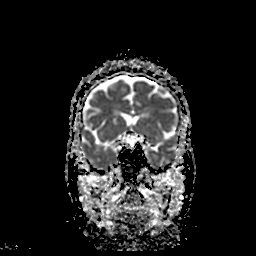
[im 23/34]
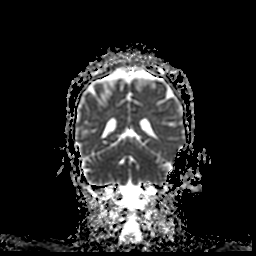
[im 34/34]
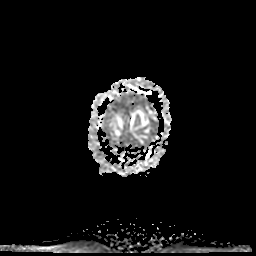

[36 of 48 positions shown; findings below may reference images not displayed]

FINDINGS: Brain:

Cerebral volume is normal for age.

Redemonstrated chronic lacunar infarct within the right basal
ganglia (series 5, image 14). Prominent perivascular spaces within
the bilateral basal ganglia.

Background mild multifocal T2/FLAIR hyperintensity within the
cerebral white matter is nonspecific, but consistent with chronic
small vessel ischemic disease.

There is no acute infarct.

No evidence of intracranial mass.

No chronic intracranial blood products.

No extra-axial fluid collection.

No midline shift.

Partially empty sella turcica.

Vascular: Expected proximal arterial flow voids.

Skull and upper cervical spine: No focal marrow lesion.

Sinuses/Orbits: Visualized orbits show no acute finding. Chronic
deformity of the right orbital floor with inferior displacement of
the orbital fat. Mild ethmoid sinus mucosal thickening. No
significant mastoid effusion.
IMPRESSION: No evidence of acute intracranial abnormality, including acute
infarction.

Redemonstrated chronic right basal ganglia lacunar infarct.
Background moderate cerebral white matter chronic small vessel
ischemic disease, stable as compared to the MRI of 09/06/2016.

Mild ethmoid sinus mucosal thickening.

## 2022-11-03 NOTE — Progress Notes (Signed)
 [address] - AMBULATORY [id number] INTERNAL MEDICINE - [facility] [address] [place] [id number]  11/03/22  Subjective    HPI:  Marilyn Estrada is a 77 y.o. female who presents to the office today for sinus complaints.  She is unaccompanied today.  She endorses pain and drainage with bleeding ongoing for the last 4 days.  Medical history significant for hypertension, hypercholesterolemia, GERD, chronic sinusitis, vocal fatigue, osteoporosis, unspecified iron deficiency anemia, class II obesity, and history of prediabetes.  Last office visit with me was on 10/06/2022 with complaints of having a stroke and wanting a referral to cardiology.  At that time she was referred to neurology for evaluation of cognitive changes as there were no obvious signs of TIA.  Imaging results were reviewed with her and her son which did show old infarcts and she was advised there is no treatment for a TIA.  With no previous dispenses, pravastatin and amlodipine were refilled as well.  Patient reports feeling more tired than usual with acute on chronic nasal congestion.  States she was seen at an urgent care on Friday or Saturday where she was prescribed an unknown nasal spray.  He states they did nasal swabs however is unsure of what was completed including but not limited to labs or imaging.  On Sunday morning she noticed 1 episode of bright red blood after blowing her nose and thus she did not go to church.  She states this has not recurred since.  She denies any fevers, chills, body aches, diarrhea, constipation, dysuria, or known sick contacts.   Review of Systems:  Negative except for HPI   The following portions of the patient's history were reviewed and updated as appropriate: allergies, current medications, past family history, past medical history, past social history, past surgical history and problem list.  Past medical history: Past Medical History:  Diagnosis Date  . Anemia   .  Degeneration of lumbar or lumbosacral intervertebral disc 11/05/2010  . History of anemia   . History of cervical fracture 2012   C1- anterior arch  . History of cervical fracture C1  . History of gastroesophageal reflux (GERD)   . Hypertension   . Lumbar spondylosis 11/05/2010  . Obesity 11/24/2010  . Right thyroid nodule 02/28/2020  . 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 Spondylolisthesis, grade 1 11/05/2010     Medications:   Current Outpatient Medications:  .  acetaminophen  (TYLENOL ) 650 mg ER tablet, Take 1,300 mg by mouth as needed (pain)., Disp: , Rfl:  .  albuterol HFA (Ventolin HFA) 90 mcg/actuation inhaler, Inhale 2 puffs as needed., Disp: , Rfl:  .  amLODIPine (NORVASC) 10 mg tablet, Take 1 tablet (10 mg total) by mouth daily., Disp: 90 tablet, Rfl: 1 .  ascorbic acid (Vitamin C) 100 mg tablet, Take 100 mg by mouth Once Daily., Disp: , Rfl:  .  aspirin  81 mg EC tablet, Take 81 mg by mouth Once Daily., Disp: , Rfl:  .  biotin (MERIBIN) 5 mg capsule, Take 5 mg by mouth Once Daily., Disp: , Rfl:  .  calcium carbonate (TUMS) 500 mg (200 mg calcium) chewable tablet, Take 1 tablet by mouth as needed., Disp: , Rfl:  .  cetirizine (ZyrTEC) 10 mg tablet, Take 10 mg by mouth daily., Disp: , Rfl:  .  cholecalciferol 25 mcg (1,000 unit) cap, Take 1,000 Units by mouth Once Daily., Disp: , Rfl:  .  EPINEPHrine  (EPIPEN ) 0.3 mg/0.3 mL injection  syringe, Inject 0.3 mg into the muscle once as needed., Disp: , Rfl:  .  famotidine (PEPCID) 40 mg tablet, TAKE 1 TABLET(40 MG) BY MOUTH BEFORE THE EVENING MEAL, Disp: 90 tablet, Rfl: 1 .  ferrous sulfate 325 mg (65 mg iron) tablet, Take 1 tablet by mouth nightly., Disp: 90 tablet, Rfl: 0 .  fluticasone propionate (FLONASE) 50 mcg/spray nasal spray, 1 spray Once Daily., Disp: 16 g, Rfl: 2 .  pravastatin (PRAVACHOL) 40 mg tablet, Take 1 tablet (40 mg total) by mouth nightly., Disp: 90 tablet, Rfl: 1 .  wheat dextrin  (Benefiber Sugar Free, dextrin,) 3 gram/3.8 gram powd, Take 3 g by mouth every other day., Disp: , Rfl:    Allergies:  Allergies  Allergen Reactions  . Venom-Honey Bee Anaphylaxis and Itching  . Oxycodone  Itching  . Shellfish Containing Products Itching  . Sulfamethoxazole Itching  . Tramadol Itching     Social:  Social History   Socioeconomic History  . Marital status: Married    Spouse name: Not on file  . Number of children: Not on file  . Years of education: Not on file  . Highest education level: Not on file  Occupational History  . Not on file  Tobacco Use  . Smoking status: Never  . Smokeless tobacco: Never  Substance and Sexual Activity  . Alcohol use: No  . Drug use: No  . Sexual activity: Not on file  Other Topics Concern  . Not on file  Social History Narrative  . Not on file   Social Determinants of Health   Food Insecurity: Low Risk  (11/03/2022)   Food vital sign   . Within the past 12 months, you worried that your food would run out before you got money to buy more: Never true   . Within the past 12 months, the food you bought just didn't last and you didn't have money to get more: Never true  Transportation Needs: No Transportation Needs (11/03/2022)   Transportation   . In the past 12 months, has lack of reliable transportation kept you from medical appointments, meetings, work or from getting things needed for daily living? : No  Safety: Low Risk  (11/03/2022)   Safety   . How often does anyone, including family and friends, physically hurt you?: Never   . How often does anyone, including family and friends, insult or talk down to you?: Never   . How often does anyone, including family and friends, threaten you with harm?: Never   . How often does anyone, including family and friends, scream or curse at you?: Never  Living Situation: Low Risk  (11/03/2022)   Living Situation   . What is your living situation today?: I have a steady place to live   .  Think about the place you live. Do you have problems with any of the following? Choose all that apply:: None/None on this list     Surgical:  Past Surgical History:  Procedure Laterality Date  . ABDOMINAL SURGERY      Procedure: ABDOMINAL SURGERY; gastric bypass  . BREAST BIOPSY Right 06/04/2011   Procedure: BREAST BIOPSY; STEREO BX, BG FIBROADENOMA  . CHOLECYSTECTOMY  1995   Procedure: CHOLECYSTECTOMY  . CYSTOCELE REPAIR  03/19/2011   Procedure: CYSTOCELE REPAIR;  Surgeon: Marko Antionette Height, MD;  Location: [facility];  Service: Urology;  Laterality: N/A;  sacrospinalis fixation, mesh repair, and sling  . EPIDURAL BLOCK INJECTION  04/02/2011   Procedure: LUMBAR INTERLAMINAR  EPIDURAL INJECTION;  Surgeon: Louvella Lynwood Home, MD;  [phone];  Service: Orthopedics;  Laterality: Left;  L5/S1  . GASTRIC BYPASS  1995   Procedure: GASTRIC BYPASS  . HYSTERECTOMY      Procedure: HYSTERECTOMY; total  . OTHER SURGICAL HISTORY     Procedure: OTHER SURGICAL HISTORY (Bilateral foot surgery)  . POSTERIOR SPINAL FUSION  12/02/2010   Procedure: SPINAL FUSION POSTERIOR;  Surgeon: Norleen Maude Capri, MD;  Location: [facility] OR;  Service: Orthopedics;  Laterality: N/A;  L4 and L5 laminectomy with posterior spinal fusion L4-5 using pedicle screw instrumentation and iliac crest bone graft  . RADIOFREQUENCY ABLATION NERVES Left 12/22/2017   Procedure: LUMBAR RADIOFREQUENCY  L3-S1 (Side 1);  Surgeon: Joane Fairy Cave, MD;  Location: [facility];  Service: Stefanie Ast;  Laterality: Left;  . SPINAL FUSION  10.23.12   Procedure: SPINAL FUSION; L4-5 PSF (BIRKEDAL)  . TONSILLECTOMY  40 yrs ago   Procedure: TONSILLECTOMY        Objective    BP 138/77   Pulse 73   Temp 97.1 F (36.2 C) (Temporal)   Ht 1.6 m (5' 3)   Wt 93.9 kg (207 lb)   SpO2 97%   BMI 36.67 kg/m   Wt Readings from Last 3 Encounters:  11/03/22 93.9 kg (207 lb)  10/06/22 94.3 kg (208 lb)  06/15/22 93.9  kg (207 lb)    BP Readings from Last 3 Encounters:  11/03/22 138/77  10/06/22 155/84  06/15/22 134/78     Physical Exam:  Physical Exam Vitals reviewed.  Constitutional:      General: She is awake. She is not in acute distress.    Appearance: She is well-groomed. She is obese. She is ill-appearing. She is not toxic-appearing.  HENT:     Head: Normocephalic and atraumatic.     Right Ear: Hearing normal. There is impacted cerumen.     Left Ear: Hearing and tympanic membrane normal.     Nose: Congestion present.     Mouth/Throat:     Lips: Pink.     Mouth: Mucous membranes are moist.  Eyes:     General: Lids are normal.     Extraocular Movements: Extraocular movements intact.     Conjunctiva/sclera: Conjunctivae normal.  Neck:     Thyroid: No thyroid mass or thyromegaly.  Cardiovascular:     Rate and Rhythm: Normal rate and regular rhythm.     Pulses:          Radial pulses are 2+ on the right side and 2+ on the left side.     Heart sounds: Normal heart sounds.  Pulmonary:     Effort: Pulmonary effort is normal.     Breath sounds: Normal breath sounds.  Musculoskeletal:     Cervical back: Full passive range of motion without pain.     Right lower leg: No edema.     Left lower leg: No edema.  Skin:    General: Skin is warm and dry.  Neurological:     Mental Status: She is alert and oriented to person, place, and time.     Gait: Gait is intact.  Psychiatric:        Attention and Perception: Attention normal.        Mood and Affect: Mood and affect normal.        Speech: Speech normal.      Relevant portions of the electronic records were reviewed. Recent and/or relevant labs and  imaging are listed below.    Recent Labs:  CrCl cannot be calculated (Patient's most recent lab result is older than the maximum 10 days allowed.).  Lab Results  Component Value Date   WBC 6.34 06/03/2022   HGB 11.6 (L) 06/03/2022   HCT 36.1 06/03/2022   PLT 281 06/03/2022   CHOL  222 (H) 08/19/2021   TRIG 79 08/19/2021   HDL 71 08/19/2021   ALT 12 06/03/2022   AST 15 06/03/2022   NA 141 06/03/2022   K 3.6 06/03/2022   CL 106 06/03/2022   CREATININE 0.57 (L) 06/03/2022   BUN 14 06/03/2022   CO2 27 06/03/2022     Recent Imaging:  None pertinent  Assessment/Plan    1. Acute viral sinusitis     Plan Per patient's history, she has been experiencing acute symptoms no longer than 5 days in duration and with her cognitive changes, she is an unreliable historian.  I have encouraged her to go home and contact the office regarding what nasal spray was sent and and what dates she was seen.  We will call her in no less than 48 hours to follow-up.  Likely etiology is acute viral sinusitis however cannot exclude bacterial sinusitis, acute on chronic sinusitis, acute on chronic cognitive changes, UTI.  She declines point-of-care viral testing as well as urinalysis today.  Patient advised that should her symptoms last for an additional 72 hours, we would send in a short course of an oral antibiotic at that time to treat a presumed bacterial sinus infection.  Medications Prescribed No orders of the defined types were placed in this encounter.   Other Orders No orders of the defined types were placed in this encounter.     Patient expresses understanding of their current medications and use.  If a new prescription was given today, then I discussed potential side effects, drug interactions, instructions for taking the medication, and the consequences of not taking it.   Patient verbalized an understanding of these instructions. Patient is able to verbalize understanding of the care plan discussed today.   Follow up: As regularly scheduled Call sooner if needed.  Future Appointments  Date Time Provider Department Center  12/17/2022 10:00 AM Damien MARLA Bull, MD [facility] [address]  01/05/2023  8:00 AM Norman Joesph Dull, PA-C [facility] [address]   I agree that the documentation is accurate and complete.

## 2023-12-05 ENCOUNTER — Emergency Department (HOSPITAL_BASED_OUTPATIENT_CLINIC_OR_DEPARTMENT_OTHER)
Admission: EM | Admit: 2023-12-05 | Discharge: 2023-12-05 | Disposition: A | Discharge status: Home / Self Care | Attending: Emergency Medicine | Admitting: Emergency Medicine

## 2023-12-05 ENCOUNTER — Encounter (HOSPITAL_BASED_OUTPATIENT_CLINIC_OR_DEPARTMENT_OTHER): Payer: Self-pay | Admitting: Emergency Medicine

## 2023-12-05 ENCOUNTER — Emergency Department (HOSPITAL_BASED_OUTPATIENT_CLINIC_OR_DEPARTMENT_OTHER)

## 2023-12-05 ENCOUNTER — Other Ambulatory Visit: Payer: Self-pay

## 2023-12-05 DIAGNOSIS — I10 Essential (primary) hypertension: Secondary | ICD-10-CM | POA: Diagnosis not present

## 2023-12-05 DIAGNOSIS — M79671 Pain in right foot: Secondary | ICD-10-CM | POA: Diagnosis present

## 2023-12-05 DIAGNOSIS — R42 Dizziness and giddiness: Secondary | ICD-10-CM | POA: Diagnosis not present

## 2023-12-05 DIAGNOSIS — Z79899 Other long term (current) drug therapy: Secondary | ICD-10-CM | POA: Diagnosis not present

## 2023-12-05 DIAGNOSIS — Y92410 Unspecified street and highway as the place of occurrence of the external cause: Secondary | ICD-10-CM | POA: Insufficient documentation

## 2023-12-05 DIAGNOSIS — E041 Nontoxic single thyroid nodule: Secondary | ICD-10-CM | POA: Insufficient documentation
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
